# Patient Record
Sex: Female | Born: 1955
Health system: Southern US, Community
[De-identification: ages and names within clinical notes are randomized; demographics above are authoritative.]

## PROBLEM LIST (undated history)

## (undated) DIAGNOSIS — I771 Stricture of artery: Secondary | ICD-10-CM

## (undated) DIAGNOSIS — E785 Hyperlipidemia, unspecified: Secondary | ICD-10-CM

## (undated) DIAGNOSIS — Z789 Other specified health status: Secondary | ICD-10-CM

## (undated) HISTORY — DX: Other specified health status: Z78.9

## (undated) HISTORY — PX: NO PAST SURGERIES: SHX2092

---

## 1998-04-08 ENCOUNTER — Other Ambulatory Visit: Admission: RE | Admit: 1998-04-08 | Discharge: 1998-04-08 | Payer: Self-pay | Admitting: Oral Surgery

## 1998-07-13 ENCOUNTER — Other Ambulatory Visit: Admission: RE | Admit: 1998-07-13 | Discharge: 1998-07-13 | Payer: Self-pay | Admitting: Obstetrics & Gynecology

## 1999-04-13 ENCOUNTER — Other Ambulatory Visit: Admission: RE | Admit: 1999-04-13 | Discharge: 1999-04-13 | Payer: Self-pay | Admitting: Obstetrics and Gynecology

## 2000-05-25 ENCOUNTER — Other Ambulatory Visit: Admission: RE | Admit: 2000-05-25 | Discharge: 2000-05-25 | Payer: Self-pay | Admitting: Obstetrics and Gynecology

## 2001-06-25 ENCOUNTER — Other Ambulatory Visit: Admission: RE | Admit: 2001-06-25 | Discharge: 2001-06-25 | Payer: Self-pay | Admitting: Obstetrics and Gynecology

## 2002-06-27 ENCOUNTER — Other Ambulatory Visit: Admission: RE | Admit: 2002-06-27 | Discharge: 2002-06-27 | Payer: Self-pay | Admitting: Obstetrics and Gynecology

## 2003-08-26 ENCOUNTER — Other Ambulatory Visit: Admission: RE | Admit: 2003-08-26 | Discharge: 2003-08-26 | Payer: Self-pay | Admitting: Obstetrics and Gynecology

## 2004-08-31 ENCOUNTER — Other Ambulatory Visit: Admission: RE | Admit: 2004-08-31 | Discharge: 2004-08-31 | Payer: Self-pay | Admitting: Obstetrics and Gynecology

## 2005-09-01 ENCOUNTER — Other Ambulatory Visit: Admission: RE | Admit: 2005-09-01 | Discharge: 2005-09-01 | Payer: Self-pay | Admitting: Obstetrics and Gynecology

## 2008-06-29 ENCOUNTER — Emergency Department (HOSPITAL_COMMUNITY): Admission: EM | Admit: 2008-06-29 | Discharge: 2008-06-29 | Payer: Self-pay | Admitting: Emergency Medicine

## 2009-10-08 LAB — HM PAP SMEAR

## 2010-01-05 ENCOUNTER — Ambulatory Visit: Payer: Self-pay | Admitting: Family Medicine

## 2010-01-05 DIAGNOSIS — F17200 Nicotine dependence, unspecified, uncomplicated: Secondary | ICD-10-CM | POA: Insufficient documentation

## 2010-01-05 DIAGNOSIS — Z720 Tobacco use: Secondary | ICD-10-CM | POA: Insufficient documentation

## 2010-01-05 DIAGNOSIS — R5383 Other fatigue: Secondary | ICD-10-CM

## 2010-01-05 DIAGNOSIS — R5381 Other malaise: Secondary | ICD-10-CM | POA: Insufficient documentation

## 2010-01-05 LAB — HM COLONOSCOPY

## 2010-01-05 LAB — CONVERTED CEMR LAB: Vit D, 25-Hydroxy: 53 ng/mL (ref 30–89)

## 2010-01-06 ENCOUNTER — Encounter: Payer: Self-pay | Admitting: Family Medicine

## 2010-01-06 LAB — CONVERTED CEMR LAB
BUN: 17 mg/dL (ref 6–23)
Eosinophils Relative: 1.2 % (ref 0.0–5.0)
GFR calc non Af Amer: 70.07 mL/min (ref >60)
HCT: 39 % (ref 36.0–46.0)
Lymphs Abs: 2.2 10*3/uL (ref 0.7–4.0)
Monocytes Relative: 5.7 % (ref 3.0–12.0)
Neutrophils Relative %: 55.1 % (ref 43.0–77.0)
Platelets: 163 10*3/uL (ref 150.0–400.0)
Potassium: 4.4 meq/L (ref 3.5–5.1)
TSH: 1.02 microintl units/mL (ref 0.35–5.50)
WBC: 5.9 10*3/uL (ref 4.5–10.5)

## 2010-01-20 ENCOUNTER — Encounter: Payer: Self-pay | Admitting: Family Medicine

## 2010-02-22 ENCOUNTER — Encounter (INDEPENDENT_AMBULATORY_CARE_PROVIDER_SITE_OTHER): Payer: Self-pay | Admitting: Internal Medicine

## 2010-05-10 NOTE — Letter (Signed)
Summary: Records Dated 07-16-84 thru 07-08-02/[facility]  Records Dated 07-16-84 thru 07-08-02/[facility]   Imported By: Lanelle Bal 02/07/2010 12:07:39  _____________________________________________________________________  External Attachment:    Type:   Image     Comment:   External Document

## 2010-05-10 NOTE — Assessment & Plan Note (Signed)
Summary: NEW PT TO EST/CLE   Vital Signs:  Patient profile:   55 year old female Height:      62.5 inches Weight:      135 pounds BMI:     24.39 Temp:     98.4 degrees F oral Pulse rate:   76 / minute Pulse rhythm:   regular BP sitting:   110 / 70  (right arm) Cuff size:   regular  Vitals Entered By: Linde Gillis CMA Duncan Dull) (January 05, 2010 10:41 AM) CC: new patient, establish care   History of Present Illness: 55 yo here to establish care.  Fatigue- has noticed ove past several months that she is much more tired.  Busy year for her, daughter in law had twin babies, mother has cancer. Denies any difficulty sleeping, feels like she sleeps all the time. Not depressed, not anxious. No SOB, LE edema or CP No headaches.  Well woman- sees OBGYN Dr. Huel Cote yearly.  Per pt, UTD on lipid panel, mammogram, pap smear, bone density (awaiting records). Has never had a colonoscopy, does stool cards yaerly.  Tobacco abuse- currently smoking 10 cig/day (cut back from 1 ppd), has smoked for 30 years.  Quit cold [place] for 3 years when he husband quit smoking.  He is smoking now as well.  Does want to quit so she can be around for her grandchildren.  Current Medications (verified): 1)  None  Allergies (verified): No Known Drug Allergies  Past History:  Family History: Last updated: 01/05/2010 Mom- throat CA and breast CA in 88s Dad- healthy  Social History: Last updated: 01/05/2010 daughter in law, Adri Schloss. One son. current smoker.  Past Medical History: Unremarkable  Past Surgical History: Denies surgical history  Family History: Mom- throat CA and breast CA in 55s Dad- healthy  Social History: daughter in Social worker, Taralynn Quiett. One son. current smoker.  Review of Systems      See HPI General:  Complains of fatigue; denies weakness and weight loss. Eyes:  Denies blurring. ENT:  Denies difficulty swallowing. CV:  Denies chest pain or  discomfort. Resp:  Denies shortness of breath. GI:  Denies abdominal pain, bloody stools, and change in bowel habits. GU:  Denies abnormal vaginal bleeding, urinary frequency, and urinary hesitancy. MS:  Denies joint pain, joint redness, and joint swelling. Derm:  Denies rash. Neuro:  Denies headaches. Psych:  Denies anxiety and depression. Endo:  Denies cold intolerance, heat intolerance, polyuria, and weight change. Heme:  Denies abnormal bruising, bleeding, enlarge lymph nodes, fevers, pallor, and skin discoloration.  Physical Exam  General:  alert, well-developed, well-nourished, and well-hydrated.   Head:  normocephalic and atraumatic.   Eyes:  vision grossly intact, pupils equal, pupils round, and pupils reactive to light.   Ears:  R ear normal and L ear normal.   Nose:  no external deformity.   Mouth:  good dentition.   Neck:  No deformities, masses, or tenderness noted. Lungs:  Normal respiratory effort, chest expands symmetrically. Lungs are clear to auscultation, no crackles or wheezes. Heart:  Normal rate and regular rhythm. S1 and S2 normal without gallop, murmur, click, rub or other extra sounds. Abdomen:  Bowel sounds positive,abdomen soft and non-tender without masses, organomegaly or hernias noted. Msk:  normal ROM, no joint tenderness, no joint swelling, no joint warmth, and no redness over joints.   Extremities:  no edema Neurologic:  alert & oriented X3 and gait normal.   Skin:  Intact without suspicious lesions or  rashes Cervical Nodes:  No lymphadenopathy noted Psych:  Cognition and judgment appear intact. Alert and cooperative with normal attention span and concentration. No apparent delusions, illusions, hallucinations   Impression & Recommendations:  Problem # 1:  FATIGUE (ICD-780.79) Assessment New Likely due to busy lifestyle with recent stressors, but will check BMET, CBC, TSH to rule out reversible causes. Orders: Venipuncture (16109) TLB-BMP (Basic  Metabolic Panel-BMET) (80048-METABOL) TLB-CBC Platelet - w/Differential (85025-CBCD) TLB-TSH (Thyroid Stimulating Hormone) (84443-TSH)  Problem # 2:  R/O VITAMIN D DEFICIENCY (ICD-268.9) Assessment: New Given new history of fatigue and no calcium or vit d supplementation, will check Vit D today. Orders: T-Vitamin D (25-Hydroxy) [id number])  Problem # 3:  TOBACCO USER (ICD-305.1) Assessment: Unchanged  Counselled on smoking cessation.  She will think about it and talk with her husband, may want to try Chantix.  Orders: Tobacco use cessation intermediate 3-10 minutes (91478)  Prior Medications (reviewed today): None Current Allergies (reviewed today): No known allergies    Prevention & Chronic Care Immunizations   Influenza vaccine: Not documented    Tetanus booster: Not documented    Pneumococcal vaccine: Not documented  Colorectal Screening   Hemoccult: Not documented   Hemoccult due: Not Indicated    Colonoscopy: Not documented   Colonoscopy action/deferral: Deferred  (01/05/2010)   Colonoscopy due: Refused  (01/05/2010)  Other Screening   Pap smear: normal  (10/08/2009)   Pap smear due: 10/09/2010    Mammogram: normal  (07/12/2009)   Mammogram due: 07/13/2010   Smoking status: Not documented  Lipids   Total Cholesterol: Not documented   Lipid panel action/deferral: Not indicated   LDL: Not documented   LDL Direct: Not documented   HDL: Not documented   Triglycerides: Not documented   Nursing Instructions: Give tetanus booster today Give Flu vaccine today    Colonoscopy Next Due:  Refused Hemoccult Next Due:  Not Indicated PAP Result Date:  10/08/2009 PAP Result:  normal Mammogram Result Date:  07/12/2009 Mammogram Result:  normal    Family History:    Mom- throat CA and breast CA in 49s    Dad- healthy  Social History:    daughter in Social worker, Atia Haupt.    One son.    current smoker.   Appended Document: NEW PT TO  EST/CLE     Allergies: No Known Drug Allergies   Other Orders: Flu Vaccine 72yrs + (29562) Admin 1st Vaccine (13086) Tdap => 22yrs IM (57846) Admin of Any Addtl Vaccine (96295)    Immunizations Administered:  Influenza Vaccine # 1:    Vaccine Type: Fluvax 3+    Site: left deltoid    Mfr: GlaxoSmithKline    Dose: 0.5 ml    Route: IM    Given by: Linde Gillis CMA (AAMA)    Exp. Date: 10/08/2010    Lot #: [id number]    VIS given: 11/02/09 version given January 05, 2010.  Tetanus Vaccine:    Vaccine Type: Tdap    Site: left deltoid    Mfr: GlaxoSmithKline    Dose: 0.5 ml    Route: IM    Given by: Linde Gillis CMA (AAMA)    Exp. Date: 01/28/2012    Lot #: [id number]    VIS given: 02/26/08 version given January 05, 2010.

## 2010-05-10 NOTE — Letter (Signed)
Summary: Generic Letter  [place] [facility]  [address] [place], [place] [address]   Phone: [phone]  Fax: [phone]    01/06/2010  Deanna Bell [address] [place], [place]  [address]  Dear Ms. Wessling,     We have received your lab results and Dr. Dayton Martes says that all of your labs including blood count, thyroid, vitamam d, electrolytes, and kidney function are within normal limits.  Enclosed is a copy of your lab results.       Sincerely,       Linde Gillis CMA (AAMA)for Dr. Ruthe Mannan, MD

## 2010-05-10 NOTE — Progress Notes (Signed)
Summary: Office Visit//DEPRESSION SCREENING  Office Visit//DEPRESSION SCREENING   Imported By: Arta Bruce 02/24/2010 10:46:07  _____________________________________________________________________  External Attachment:    Type:   Image     Comment:   External Document

## 2010-10-31 ENCOUNTER — Ambulatory Visit (INDEPENDENT_AMBULATORY_CARE_PROVIDER_SITE_OTHER): Payer: BC Managed Care – PPO | Admitting: Family Medicine

## 2010-10-31 ENCOUNTER — Encounter: Payer: Self-pay | Admitting: Family Medicine

## 2010-10-31 VITALS — BP 116/78 | HR 78 | Temp 99.3°F | Wt 136.5 lb

## 2010-10-31 DIAGNOSIS — J029 Acute pharyngitis, unspecified: Secondary | ICD-10-CM | POA: Insufficient documentation

## 2010-10-31 NOTE — Progress Notes (Signed)
  Subjective:    Patient ID: Deanna Bell, female    DOB: [birth year], 55 y.o.   MRN: [id number]  HPI  Sore Throat Patient complains of sore throat. Associated symptoms include swollen glands. Onset of symptoms was 1 day ago, and have been unchanged since that time. She is drinking plenty of fluids. She has not had recent close exposure to someone with proven streptococcal pharyngitis although she does take care of her grand children.   Review of Systems See HPI    Objective:   Physical Exam BP 116/78  Pulse 78  Temp(Src) 99.3 F (37.4 C) (Oral)  Wt 136 lb 8 oz (61.916 kg) Gen: alert, pleasant, NAD HEENT:  Mild pharyngeal erythema, no exudate, no edema. +mild shotty adenopathy TMs clear bilaterally Lungs:  CTA bilaterally CVS: RRR        Assessment & Plan:   1. Pharyngitis    New.  Likely viral. Rapid strep neg. Advised supportive care. See pt instructions for details.

## 2010-10-31 NOTE — Patient Instructions (Signed)
Sore Throat Sore throats may be caused by bacteria and viruses. They may also be caused by:  Smoking.   Pollution.   Allergies.   HOME CARE INSTRUCTIONS  To treat a sore throat, take mild pain medicine.   Increase your fluids.   Eat a soft diet.   Do not smoke.   Gargling with warm water or salt water (1 tsp. salt in 8 oz. water) can be helpful.   Try throat sprays or lozenges or sucking on hard candy will also ease the symptoms.  Call your doctor if your sore throat lasts longer than 1 week.  SEEK IMMEDIATE MEDICAL CARE IF YOU HAVE:   Breathing difficulty.   Increased swelling in the throat.   Pain so severe you are unable to swallow fluids or your saliva.   A severe headache, high fever, vomiting, or red rash.  Document Released: 05/04/2004 Document Re-Released: 06/23/2008 [facility] Patient Information 2011 [place], [place].

## 2011-01-30 ENCOUNTER — Ambulatory Visit (INDEPENDENT_AMBULATORY_CARE_PROVIDER_SITE_OTHER): Payer: BC Managed Care – PPO | Admitting: Family Medicine

## 2011-01-30 ENCOUNTER — Encounter: Payer: Self-pay | Admitting: Family Medicine

## 2011-01-30 VITALS — BP 112/82 | HR 74 | Temp 99.2°F | Ht 61.0 in | Wt 134.5 lb

## 2011-01-30 DIAGNOSIS — L989 Disorder of the skin and subcutaneous tissue, unspecified: Secondary | ICD-10-CM

## 2011-01-30 MED ORDER — AZITHROMYCIN 250 MG PO TABS: ORAL_TABLET | ORAL | Status: DC

## 2011-01-30 NOTE — Patient Instructions (Signed)
Good to see you. Please stop by to see Deanna Bell on your way out. 

## 2011-01-30 NOTE — Progress Notes (Signed)
  Subjective:    Patient ID: Deanna Bell, female    DOB: [birth year], 55 y.o.   MRN: [id number]  HPI  55 yo here for ?cyst on inner aspect of right foot. Has been there for months, but now it is growing in size and causing discomfort. No redness or warmth. Has not drained anything.  Per pt, had similar cyst removed from toe years ago and was told it was a ganglion.  Patient Active Problem List  Diagnoses  . TOBACCO USER  . FATIGUE  . Pharyngitis  . Foot lesion   No past medical history on file. No past surgical history on file. History  Substance Use Topics  . Smoking status: Current Everyday Smoker  . Smokeless tobacco: Not on file  . Alcohol Use:    Family History  Problem Relation Age of Onset  . Cancer Mother 74    throat and breast    No Known Allergies No current outpatient prescriptions on file prior to visit.   The PMH, PSH, Social History, Family History, Medications, and allergies have been reviewed in [facility], and have been updated if relevant.   Review of Systems    See HPI Objective:   Physical Exam BP 112/82  Pulse 74  Temp(Src) 99.2 F (37.3 C) (Oral)  Ht 5\' 1"  (1.549 m)  Wt 134 lb 8 oz (61.009 kg)  BMI 25.41 kg/m2  General:  Well-developed,well-nourished,in no acute distress; alert,appropriate and cooperative throughout examination Head:  normocephalic and atraumatic.   Msk:  Large, non fluctuant subcutaneous lesion on inner aspect of right foot.  Not freely moveable.  Psych:  Cognition and judgment appear intact. Alert and cooperative with normal attention span and concentration. No apparent delusions, illusions, hallucinations     Assessment & Plan:   1. Foot lesion  Ambulatory referral to Podiatry  Deteriorated. Not consistent with ganglion cyst but does appear like cyst. Will refer to podiatry for further work up.

## 2011-07-06 ENCOUNTER — Telehealth: Payer: Self-pay | Admitting: Family Medicine

## 2011-07-06 NOTE — Telephone Encounter (Signed)
Noted  

## 2011-07-06 NOTE — Telephone Encounter (Signed)
Triage Record Num: [id number] Operator: Geanie Berlin Patient Name: Deanna Bell Call Date & Time: 07/06/2011 2:40:24PM Patient Phone: [phone] PCP: Ruthe Mannan Patient Gender: Female PCP Fax : [phone] Patient DOB: Oct 13, 1955 Practice Name: [facility] Reason for Call: Caller: Debbie/Patient; PCP: Ruthe Mannan [facility]); CB#: [phone]; Call regarding Head Cold Symptoms; Onset: 07/03/11. Afebrile. Asking if needs Zpack. Body aches present. Menopausal. Home care advice given for new onset 2 or > cold symptoms per URI Guideline. Protocol(s) Used: Upper Respiratory Infection (URI) Recommended Outcome per Protocol: Provide Home/Self Care Reason for Outcome: New onset of two or more of the following symptoms: nasal congestion with runny nose; sneezing; itchy or mild sore throat; mild headache or body aches; mild fatigue; low grade fever up to 101.5 F (38.6C) usually lasting about a week Care Advice: ~ Use a cool mist humidifier to moisten air. Be sure to clean according to manufacturer's instructions. ~ Call provider if symptoms worsen or new symptoms develop. ~ Consider use of a saline nasal spray per package directions to help relieve nasal congestion. Mild symptoms of a cold can be expected to last 7 to 10 days. Sometimes, a cough associated with a cold can last up to 3 weeks. Over-the-counter cold medications may temporarily relieve the symptoms, but do not shorten the length of the cold. ~ ~ SYMPTOM / CONDITION MANAGEMENT A warm, moist compress placed on face, over eyes for 15 to 20 minutes, 5 to 6 times a day, may help relieve the congestion. ~ Most adults need to drink 6-10 eight-ounce glasses (1.2-2.0 liters) of fluids per day unless previously told to limit fluid intake for other medical reasons. Limit fluids that contain caffeine, sugar or alcohol. Urine will be a very light yellow color when you drink enough fluids. ~ Sore Throat Relief: - Use warm  salt water gargles 3 to 4 times/day, as needed (1/2 tsp. salt in 8 oz. [.2 liters] water). - Suck on hard candy, nonprescription or herbal throat lozenges (sugar-free if diabetic) - Eat soothing, soft food/fluids (broths, soups, or honey and lemon juice in hot tea, Popsicles, frozen yogurt or sherbet, scrambled eggs, cooked cereals, Jell-O or puddings) whichever is most comforting. - Avoid eating salty, spicy or acidic foods. ~ ~ Rest until symptoms improve. If more than [redacted] weeks pregnant, lie on left side when resting. Be aware that some nonprescription drugs contain both an antihistamine and decongestant. If taking a combination drug, do not take an additional decongestant. ~ Analgesic/Antipyretic Advice - NSAIDs: Consider aspirin, ibuprofen, naproxen or ketoprofen for pain or fever as directed on label or by pharmacist/provider. PRECAUTIONS: - If over 27 years of age, should not take longer than 1 week without consulting provider. EXCEPTIONS: - Should not be used if taking blood thinners or have bleeding problems. - Do not use if have history of sensitivity/allergy to any of these medications; or history of cardiovascular, ulcer, kidney, liver disease or diabetes unless approved by provider. - Do not exceed recommended dose or frequency. ~ 07/06/2011 2:55:53PM Page 1 of 2 [email or web address] [facility] Triage Call Report Patient Name: Deanna Bell continuation page/s - Do not exceed recommended dose or frequency. Respiratory Hygiene: - Cover the nose/mouth tightly with a tissue when coughing or sneezing. - Use tissue 1 time and discard in the nearest waste receptacle. - Wash hands with soap and water or alcohol-based hand rub after coming into contact with respiratory secretions and contaminated objects/materials. - Alternatively when no tissue is available, cough into the bend  of the elbow. - .Avoid touching your eyes, nose or mouth. ~ 07/06/2011 2:55:53PM Page 2 of 2  [email or web address]_[email or web address]

## 2011-07-06 NOTE — Telephone Encounter (Signed)
Caller: Debbie/Patient; PCP: Gwinda Passe); CB#: [phone];  Call regarding Head Cold Symptoms; Onset: 07/03/11.  Afebrile.  Asking if needs Zpack. Body aches present.  Menopausal.  Home care advice given for new onset  2 or > cold symptoms per URI Guideline.

## 2011-08-22 ENCOUNTER — Ambulatory Visit (INDEPENDENT_AMBULATORY_CARE_PROVIDER_SITE_OTHER): Payer: BC Managed Care – PPO | Admitting: Family Medicine

## 2011-08-22 ENCOUNTER — Encounter: Payer: Self-pay | Admitting: Family Medicine

## 2011-08-22 VITALS — BP 118/70 | HR 68 | Temp 98.1°F | Wt 134.0 lb

## 2011-08-22 DIAGNOSIS — J40 Bronchitis, not specified as acute or chronic: Secondary | ICD-10-CM

## 2011-08-22 MED ORDER — AZITHROMYCIN 250 MG PO TABS: ORAL_TABLET | ORAL | Status: AC

## 2011-08-22 MED ORDER — ALBUTEROL SULFATE HFA 108 (90 BASE) MCG/ACT IN AERS: 2.0000 | INHALATION_SPRAY | Freq: Four times a day (QID) | RESPIRATORY_TRACT | Status: DC | PRN

## 2011-08-22 NOTE — Patient Instructions (Signed)
Good to see you. You have bronchitis. Take Zpack as directed. Use albuterol inhaler as needed for shortness of breath/wheezing. Continue Mucinex.

## 2011-08-22 NOTE — Progress Notes (Signed)
SUBJECTIVE:  Deanna Bell is a 55 y.o. female who complains of coryza, congestion and dry cough for 20 days. She denies a history of anorexia, chest pain, chills, dizziness, myalgias, nausea and shortness of breath and denies a history of asthma. Patient admits to smoke cigarettes.   Patient Active Problem List  Diagnoses  . TOBACCO USER  . FATIGUE  . Pharyngitis  . Foot lesion   No past medical history on file. No past surgical history on file. History  Substance Use Topics  . Smoking status: Current Everyday Smoker  . Smokeless tobacco: Not on file   Comment: Down to 4 cigs a day, trying to quit as of 08/22/11.  Marland Kitchen Alcohol Use: Not on file   Family History  Problem Relation Age of Onset  . Cancer Mother 34    throat and breast    No Known Allergies Current Outpatient Prescriptions on File Prior to Visit  Medication Sig Dispense Refill  . ibuprofen (ADVIL,MOTRIN) 200 MG tablet Take 200 mg by mouth every 6 (six) hours as needed.         The PMH, PSH, Social History, Family History, Medications, and allergies have been reviewed in [facility], and have been updated if relevant.   OBJECTIVE: BP 118/70  Pulse 68  Temp(Src) 98.1 F (36.7 C) (Oral)  Wt 134 lb (60.782 kg)  She appears well, vital signs are as noted. Ears normal.  Throat and pharynx normal.  Neck supple. No adenopathy in the neck. Nose is congested. Sinuses non tender. Pos faint exp wheezes bilaterally, no increased WOB or crackles.  ASSESSMENT:  bronchitis  PLAN: Zpack as directed. Albuterol as needed- see pt instructions for details. Symptomatic therapy suggested: push fluids, rest and return office visit prn if symptoms persist or worsen. Call or return to clinic prn if these symptoms worsen or fail to improve as anticipated.

## 2013-08-08 LAB — HM MAMMOGRAPHY: HM MAMMO: NORMAL

## 2013-12-12 ENCOUNTER — Ambulatory Visit (INDEPENDENT_AMBULATORY_CARE_PROVIDER_SITE_OTHER): Payer: BC Managed Care – PPO

## 2013-12-12 DIAGNOSIS — Z23 Encounter for immunization: Secondary | ICD-10-CM

## 2014-01-12 ENCOUNTER — Ambulatory Visit: Payer: BC Managed Care – PPO | Admitting: Family Medicine

## 2014-01-14 ENCOUNTER — Encounter: Payer: Self-pay | Admitting: Family Medicine

## 2014-01-14 ENCOUNTER — Ambulatory Visit
Admission: RE | Admit: 2014-01-14 | Discharge: 2014-01-14 | Disposition: A | Discharge status: Home / Self Care | Payer: BC Managed Care – PPO | Source: Ambulatory Visit | Attending: Family Medicine | Admitting: Family Medicine

## 2014-01-14 ENCOUNTER — Ambulatory Visit (INDEPENDENT_AMBULATORY_CARE_PROVIDER_SITE_OTHER)
Admission: RE | Admit: 2014-01-14 | Discharge: 2014-01-14 | Disposition: A | Discharge status: Home / Self Care | Payer: BC Managed Care – PPO | Source: Ambulatory Visit | Attending: Family Medicine | Admitting: Family Medicine

## 2014-01-14 ENCOUNTER — Ambulatory Visit (INDEPENDENT_AMBULATORY_CARE_PROVIDER_SITE_OTHER): Payer: BC Managed Care – PPO | Admitting: Family Medicine

## 2014-01-14 VITALS — BP 118/60 | HR 69 | Temp 98.4°F | Ht 61.25 in | Wt 141.2 lb

## 2014-01-14 DIAGNOSIS — M25562 Pain in left knee: Secondary | ICD-10-CM

## 2014-01-14 DIAGNOSIS — M7652 Patellar tendinitis, left knee: Secondary | ICD-10-CM

## 2014-01-14 DIAGNOSIS — M25561 Pain in right knee: Secondary | ICD-10-CM

## 2014-01-14 NOTE — Progress Notes (Signed)
Pre visit review using our clinic review tool, if applicable. No additional management support is needed unless otherwise documented below in the visit note. 

## 2014-01-14 NOTE — Patient Instructions (Signed)
BODYHELIX  Www.bodyhelix.com  Use website instuctions for measurement of limb to determine size.   Look for "Patellar Helix" - this should be placed underneath the kneecap on the affected side and above the bony part at the upper end of your tibia. - It should fit in the soft spot where your patellar tendon is located.  Over the years, I have found that athletes and active people like this product the most with patellar tendonitis. It costs about 40 dollars.  (I have no financial interest in this company and gain nothing from recommending their products)   REFERRALS TO SPECIALISTS, SPECIAL TESTS (MRI, CT, ULTRASOUNDS)  MARION or LINDA will help you. ASK CHECK-IN FOR HELP.  Imaging / Special Testing referrals sometimes can be done same day if EMERGENCY, but others can take 2 or 3 days to get an appointment. Starting in 2015, many of the new Medicare plans and Obamacare plans take much longer.   Specialist appointment times vary a great deal, based on their schedule / openings. -- Some specialists have very long wait times. (Example. Dermatology. Multiple months  for non-cancer)

## 2014-01-14 NOTE — Progress Notes (Signed)
Dr. Frederico Hamman T. Sumayah Bearse, MD, [facility] [place], [address] Phone: [phone] Fax: [phone]  01/14/2014  Patient: Deanna Bell, MRN: [id number], DOB: 06-Jan-1956, 58 y.o.  Primary Physician:  Arnette Norris, MD  Chief Complaint: Knee Pain  Subjective:   Deanna Bell is a 58 y.o. very pleasant female patient who presents with the following:  Consulting Physician: Dr. Deborra Medina Reason: B knee pain  Both knees are hurting and progressing in the last 4 years. Do not hurt all the time. Steps are the worst.  No old injuries or surgery. Has always been a walker. Working out in the morning. Ibuprofen will help some.   Left knee worse - patellar tendon and anterior knee pain The patient has not had an effusion. No symptomatic giving-way. No mechanical clicking. Joint has not locked up. Patient has been able to walk without limping. The patient does not have pain going up and down stairs or rising from a seated position.   Pain location: more anterior, L > R Current physical activity: walking Prior Knee Surgery: none Current pain meds: Motrin Bracing: none  Past Medical History, Surgical History, Social History, Family History, Problem List, Medications, and Allergies have been reviewed and updated if relevant.  GEN: No fevers, chills. Nontoxic. Primarily MSK c/o today. MSK: Detailed in the HPI GI: tolerating PO intake without difficulty Neuro: No numbness, parasthesias, or tingling associated. Otherwise the pertinent positives of the ROS are noted above.   Objective:   BP 118/60  Pulse 69  Temp(Src) 98.4 F (36.9 C) (Oral)  Ht 5' 1.25" (1.556 m)  Wt 141 lb 4 oz (64.071 kg)  BMI 26.46 kg/m2  SpO2 97%   GEN: WDWN, NAD, Non-toxic, Alert & Oriented x 3 HEENT: Atraumatic, Normocephalic.  Ears and Nose: No external deformity. EXTR: No clubbing/cyanosis/edema NEURO: Normal gait.  PSYCH: Normally interactive.  Conversant. Not depressed or anxious appearing.  Calm demeanor.   Knee:  B Gait: Normal heel toe pattern ROM: 0-130 Effusion: neg Echymosis or edema: none Patellar tendon  TTP on the L Painful PLICA: neg Patellar grind: negative Medial and lateral patellar facet loading: negative medial and lateral joint lines:NT Mcmurray's neg Flexion-pinch neg Varus and valgus stress: stable Lachman: neg Ant and Post drawer: neg Hip abduction, IR, ER: WNL Hip flexion str: 5/5 Hip abd: 5/5 Quad: 5/5 VMO atrophy:No Hamstring concentric and eccentric: 5/5   Radiology: Dg Knee Ap/lat W/sunrise Left  01/14/2014   CLINICAL DATA:  Left knee pain  EXAM: DG KNEE - 3 VIEWS  COMPARISON:  None.  FINDINGS: There is no evidence of fracture, dislocation, or joint effusion. There is no evidence of arthropathy or other focal bone abnormality. Soft tissues are unremarkable.  IMPRESSION: Negative.   Electronically Signed   By: Kathreen Devoid   On: 01/14/2014 11:01   Dg Knee Ap/lat W/sunrise Right  01/14/2014   CLINICAL DATA:  Right knee pain.  EXAM: DG KNEE - 3 VIEWS  COMPARISON:  None.  FINDINGS: There is no evidence of fracture, dislocation, or joint effusion. There is no evidence of arthropathy . Minimal spurring of superior aspect of patella is noted. Soft tissues are unremarkable.  IMPRESSION: No significant abnormality seen in the right knee.   Electronically Signed   By: Sabino Dick M.D.   On: 01/14/2014 11:00    Assessment and Plan:   Patellar tendonitis, left - Plan: Ambulatory referral to Physical Therapy  Right knee  pain - Plan: DG Knee AP/LAT W/Sunrise Right, Ambulatory referral to Physical Therapy  Left knee pain - Plan: DG Knee AP/LAT W/Sunrise Left, Ambulatory referral to Physical Therapy  Minimal OA, but enough that it may be symptomatic at times. L Patellar tendinopathy is most painful right now.  Reviewed anatomy. Reviewed knee rehab from [facility] for patellar tendonitis.  Patellar  compression strap recommended for patient. Given information about "Patellar Helix" strap from [employer].  Formal PT for iontophoresis and eccentrics  I appreciate the opportunity to evaluate this very friendly patient. If you have any question regarding her care or prognosis, do not hesitate to ask.   Follow-up: prn  New Prescriptions   No medications on file   Orders Placed This Encounter  Procedures  . DG Knee AP/LAT W/Sunrise Right  . DG Knee AP/LAT W/Sunrise Left  . Ambulatory referral to Physical Therapy    Signed,  Frederico Hamman T. Jakiyah Stepney, MD   Patient's Medications  New Prescriptions   No medications on file  Previous Medications   IBUPROFEN (ADVIL,MOTRIN) 200 MG TABLET    Take 200 mg by mouth every 6 (six) hours as needed.    Modified Medications   No medications on file  Discontinued Medications   ALBUTEROL (PROVENTIL HFA;VENTOLIN HFA) 108 (90 BASE) MCG/ACT INHALER    Inhale 2 puffs into the lungs every 6 (six) hours as needed for wheezing.

## 2014-01-15 ENCOUNTER — Telehealth: Payer: Self-pay | Admitting: Family Medicine

## 2014-01-15 NOTE — Telephone Encounter (Signed)
emmi smoking

## 2014-01-23 ENCOUNTER — Telehealth: Payer: Self-pay | Admitting: Family Medicine

## 2014-01-23 NOTE — Telephone Encounter (Signed)
i think that is ok

## 2014-01-23 NOTE — Telephone Encounter (Signed)
Called the patient to schedule her PT. She said that she has been doing all the exercises you told her to do and she is not having any pain at this time. She also got the knee brace you recommended. She wants to cancel the PT and will call if she gets in any more pain.

## 2014-06-30 ENCOUNTER — Other Ambulatory Visit: Payer: Self-pay

## 2014-07-07 ENCOUNTER — Encounter: Payer: Self-pay | Admitting: Family Medicine

## 2014-07-14 ENCOUNTER — Other Ambulatory Visit: Payer: Self-pay

## 2014-07-16 ENCOUNTER — Other Ambulatory Visit: Payer: Self-pay | Admitting: Family Medicine

## 2014-07-16 DIAGNOSIS — Z01419 Encounter for gynecological examination (general) (routine) without abnormal findings: Secondary | ICD-10-CM | POA: Insufficient documentation

## 2014-07-20 ENCOUNTER — Other Ambulatory Visit (INDEPENDENT_AMBULATORY_CARE_PROVIDER_SITE_OTHER): Payer: BLUE CROSS/BLUE SHIELD

## 2014-07-20 DIAGNOSIS — Z01419 Encounter for gynecological examination (general) (routine) without abnormal findings: Secondary | ICD-10-CM

## 2014-07-20 DIAGNOSIS — Z0000 Encounter for general adult medical examination without abnormal findings: Secondary | ICD-10-CM

## 2014-07-20 LAB — CBC WITH DIFFERENTIAL/PLATELET
BASOS PCT: 0.4 % (ref 0.0–3.0)
Basophils Absolute: 0 10*3/uL (ref 0.0–0.1)
Eosinophils Absolute: 0.1 10*3/uL (ref 0.0–0.7)
Eosinophils Relative: 1.3 % (ref 0.0–5.0)
HCT: 40.3 % (ref 36.0–46.0)
HEMOGLOBIN: 13.8 g/dL (ref 12.0–15.0)
LYMPHS PCT: 33.2 % (ref 12.0–46.0)
Lymphs Abs: 1.8 10*3/uL (ref 0.7–4.0)
MCHC: 34.1 g/dL (ref 30.0–36.0)
MCV: 89.5 fl (ref 78.0–100.0)
MONOS PCT: 4.4 % (ref 3.0–12.0)
Monocytes Absolute: 0.2 10*3/uL (ref 0.1–1.0)
NEUTROS ABS: 3.2 10*3/uL (ref 1.4–7.7)
Neutrophils Relative %: 60.7 % (ref 43.0–77.0)
Platelets: 178 10*3/uL (ref 150.0–400.0)
RBC: 4.51 Mil/uL (ref 3.87–5.11)
RDW: 13 % (ref 11.5–15.5)
WBC: 5.3 10*3/uL (ref 4.0–10.5)

## 2014-07-20 LAB — COMPREHENSIVE METABOLIC PANEL
ALK PHOS: 58 U/L (ref 39–117)
ALT: 10 U/L (ref 0–35)
AST: 16 U/L (ref 0–37)
Albumin: 4.3 g/dL (ref 3.5–5.2)
BILIRUBIN TOTAL: 0.5 mg/dL (ref 0.2–1.2)
BUN: 15 mg/dL (ref 6–23)
CO2: 31 mEq/L (ref 19–32)
Calcium: 9.7 mg/dL (ref 8.4–10.5)
Chloride: 104 mEq/L (ref 96–112)
Creatinine, Ser: 0.84 mg/dL (ref 0.40–1.20)
GFR: 73.7 mL/min (ref >60.00)
GLUCOSE: 87 mg/dL (ref 70–99)
Potassium: 4.3 mEq/L (ref 3.5–5.1)
SODIUM: 140 meq/L (ref 135–145)
TOTAL PROTEIN: 7.6 g/dL (ref 6.0–8.3)

## 2014-07-20 LAB — LIPID PANEL
CHOL/HDL RATIO: 4
CHOLESTEROL: 214 mg/dL — AB (ref 0–200)
HDL: 56.5 mg/dL (ref >39.00)
LDL CALC: 138 mg/dL — AB (ref 0–99)
NonHDL: 157.5
Triglycerides: 97 mg/dL (ref 0.0–149.0)
VLDL: 19.4 mg/dL (ref 0.0–40.0)

## 2014-07-20 LAB — TSH: TSH: 1.65 u[IU]/mL (ref 0.35–4.50)

## 2014-07-28 ENCOUNTER — Encounter: Payer: Self-pay | Admitting: Family Medicine

## 2014-07-28 ENCOUNTER — Ambulatory Visit (INDEPENDENT_AMBULATORY_CARE_PROVIDER_SITE_OTHER): Payer: BLUE CROSS/BLUE SHIELD | Admitting: Family Medicine

## 2014-07-28 VITALS — BP 112/62 | HR 76 | Temp 98.2°F | Ht 61.0 in | Wt 137.0 lb

## 2014-07-28 DIAGNOSIS — Z1211 Encounter for screening for malignant neoplasm of colon: Secondary | ICD-10-CM

## 2014-07-28 DIAGNOSIS — Z01419 Encounter for gynecological examination (general) (routine) without abnormal findings: Secondary | ICD-10-CM

## 2014-07-28 DIAGNOSIS — Z0000 Encounter for general adult medical examination without abnormal findings: Secondary | ICD-10-CM

## 2014-07-28 NOTE — Assessment & Plan Note (Signed)
Reviewed preventive care protocols, scheduled due services, and updated immunizations Discussed nutrition, exercise, diet, and healthy lifestyle.  Agrees to IFOB- order placed.

## 2014-07-28 NOTE — Patient Instructions (Signed)
It was great to see you. Please stop by the lab to pick up your stool cards.

## 2014-07-28 NOTE — Progress Notes (Signed)
Pre visit review using our clinic review tool, if applicable. No additional management support is needed unless otherwise documented below in the visit note. 

## 2014-07-28 NOTE — Progress Notes (Signed)
Subjective:   Patient ID: Deanna Bell, female    DOB: [birth year], 59 y.o.   MRN: [id number]  Deanna Bell is a pleasant 59 y.o. year old female who presents to clinic today with Annual Exam  on 07/28/2014  HPI:  Has GYN- sees Dr. Marvel Plan. Had pap smear in 02/2014 Mammogram 08/2013 - [facility]- mammogram appt scheduled for next week  Sees derm every 6 months- has follow up scheduled in May.   Lab Results  Component Value Date   WBC 5.3 07/20/2014   HGB 13.8 07/20/2014   HCT 40.3 07/20/2014   MCV 89.5 07/20/2014   PLT 178.0 07/20/2014   Lab Results  Component Value Date   ALT 10 07/20/2014   AST 16 07/20/2014   ALKPHOS 58 07/20/2014   BILITOT 0.5 07/20/2014   Lab Results  Component Value Date   NA 140 07/20/2014   K 4.3 07/20/2014   CL 104 07/20/2014   CO2 31 07/20/2014   Lab Results  Component Value Date   CHOL 214* 07/20/2014   HDL 56.50 07/20/2014   LDLCALC 138* 07/20/2014   TRIG 97.0 07/20/2014   CHOLHDL 4 07/20/2014   Lab Results  Component Value Date   CREATININE 0.84 07/20/2014   Current Outpatient Prescriptions on File Prior to Visit  Medication Sig Dispense Refill  . ibuprofen (ADVIL,MOTRIN) 200 MG tablet Take 200 mg by mouth every 6 (six) hours as needed.       No current facility-administered medications on file prior to visit.    No Known Allergies  History reviewed. No pertinent past medical history.  History reviewed. No pertinent past surgical history.  Family History  Problem Relation Age of Onset  . Cancer Mother 85    throat and breast     History   Social History  . Marital Status: Single    Spouse Name: N/A  . Number of Children: 1  . Years of Education: N/A   Occupational History  .     Social History Main Topics  . Smoking status: Current Every Day Smoker  . Smokeless tobacco: Not on file     Comment: Down to 6 cigs a day, trying to quit   . Alcohol Use: No  . Drug Use: No  . Sexual Activity: Not on file    Other Topics Concern  . Not on file   Social History Narrative   The PMH, PSH, Social History, Family History, Medications, and allergies have been reviewed in [facility], and have been updated if relevant.   Review of Systems  Constitutional: Negative.   HENT: Negative.   Respiratory: Negative.   Cardiovascular: Negative.   Gastrointestinal: Negative.   Endocrine: Negative.   Genitourinary: Negative.   Musculoskeletal: Negative.   Skin: Negative.   Allergic/Immunologic: Negative.   Neurological: Negative.   Hematological: Negative.   Psychiatric/Behavioral: Negative.   All other systems reviewed and are negative.      Objective:    BP 112/62 mmHg  Pulse 76  Temp(Src) 98.2 F (36.8 C) (Oral)  Ht 5\' 1"  (1.549 m)  Wt 137 lb (62.143 kg)  BMI 25.90 kg/m2  SpO2 96%   Physical Exam    General:  Well-developed,well-nourished,in no acute distress; alert,appropriate and cooperative throughout examination Head:  normocephalic and atraumatic.   Eyes:  vision grossly intact, pupils equal, pupils round, and pupils reactive to light.   Ears:  R ear normal and L ear normal.   Nose:  no external deformity.  Mouth:  good dentition.   Neck:  No deformities, masses, or tenderness noted. Lungs:  Normal respiratory effort, chest expands symmetrically. Lungs are clear to auscultation, no crackles or wheezes. Heart:  Normal rate and regular rhythm. S1 and S2 normal without gallop, murmur, click, rub or other extra sounds. Abdomen:  Bowel sounds positive,abdomen soft and non-tender without masses, organomegaly or hernias noted. Msk:  No deformity or scoliosis noted of thoracic or lumbar spine.   Extremities:  No clubbing, cyanosis, edema, or deformity noted with normal full range of motion of all joints.   Neurologic:  alert & oriented X3 and gait normal.   Skin:  Intact without suspicious lesions or rashes Cervical Nodes:  No lymphadenopathy noted Axillary Nodes:  No palpable  lymphadenopathy Psych:  Cognition and judgment appear intact. Alert and cooperative with normal attention span and concentration. No apparent delusions, illusions, hallucinations      Assessment & Plan:   Well woman exam No Follow-up on file.

## 2014-08-03 ENCOUNTER — Other Ambulatory Visit (INDEPENDENT_AMBULATORY_CARE_PROVIDER_SITE_OTHER): Payer: BLUE CROSS/BLUE SHIELD

## 2014-08-03 DIAGNOSIS — Z1211 Encounter for screening for malignant neoplasm of colon: Secondary | ICD-10-CM | POA: Diagnosis not present

## 2014-08-03 LAB — FECAL OCCULT BLOOD, IMMUNOCHEMICAL: Fecal Occult Bld: NEGATIVE

## 2014-08-04 ENCOUNTER — Encounter: Payer: Self-pay | Admitting: *Deleted

## 2014-08-21 ENCOUNTER — Encounter: Payer: Self-pay | Admitting: *Deleted

## 2014-11-10 IMAGING — CR DG KNEE AP/LAT W/ SUNRISE*L*
3 series · 3 of 3 positions shown · non-contrast
Comparison: None.

CLINICAL DATA: Left knee pain

EXAM:
DG KNEE - 3 VIEWS

[view not recorded (1 of 3)]
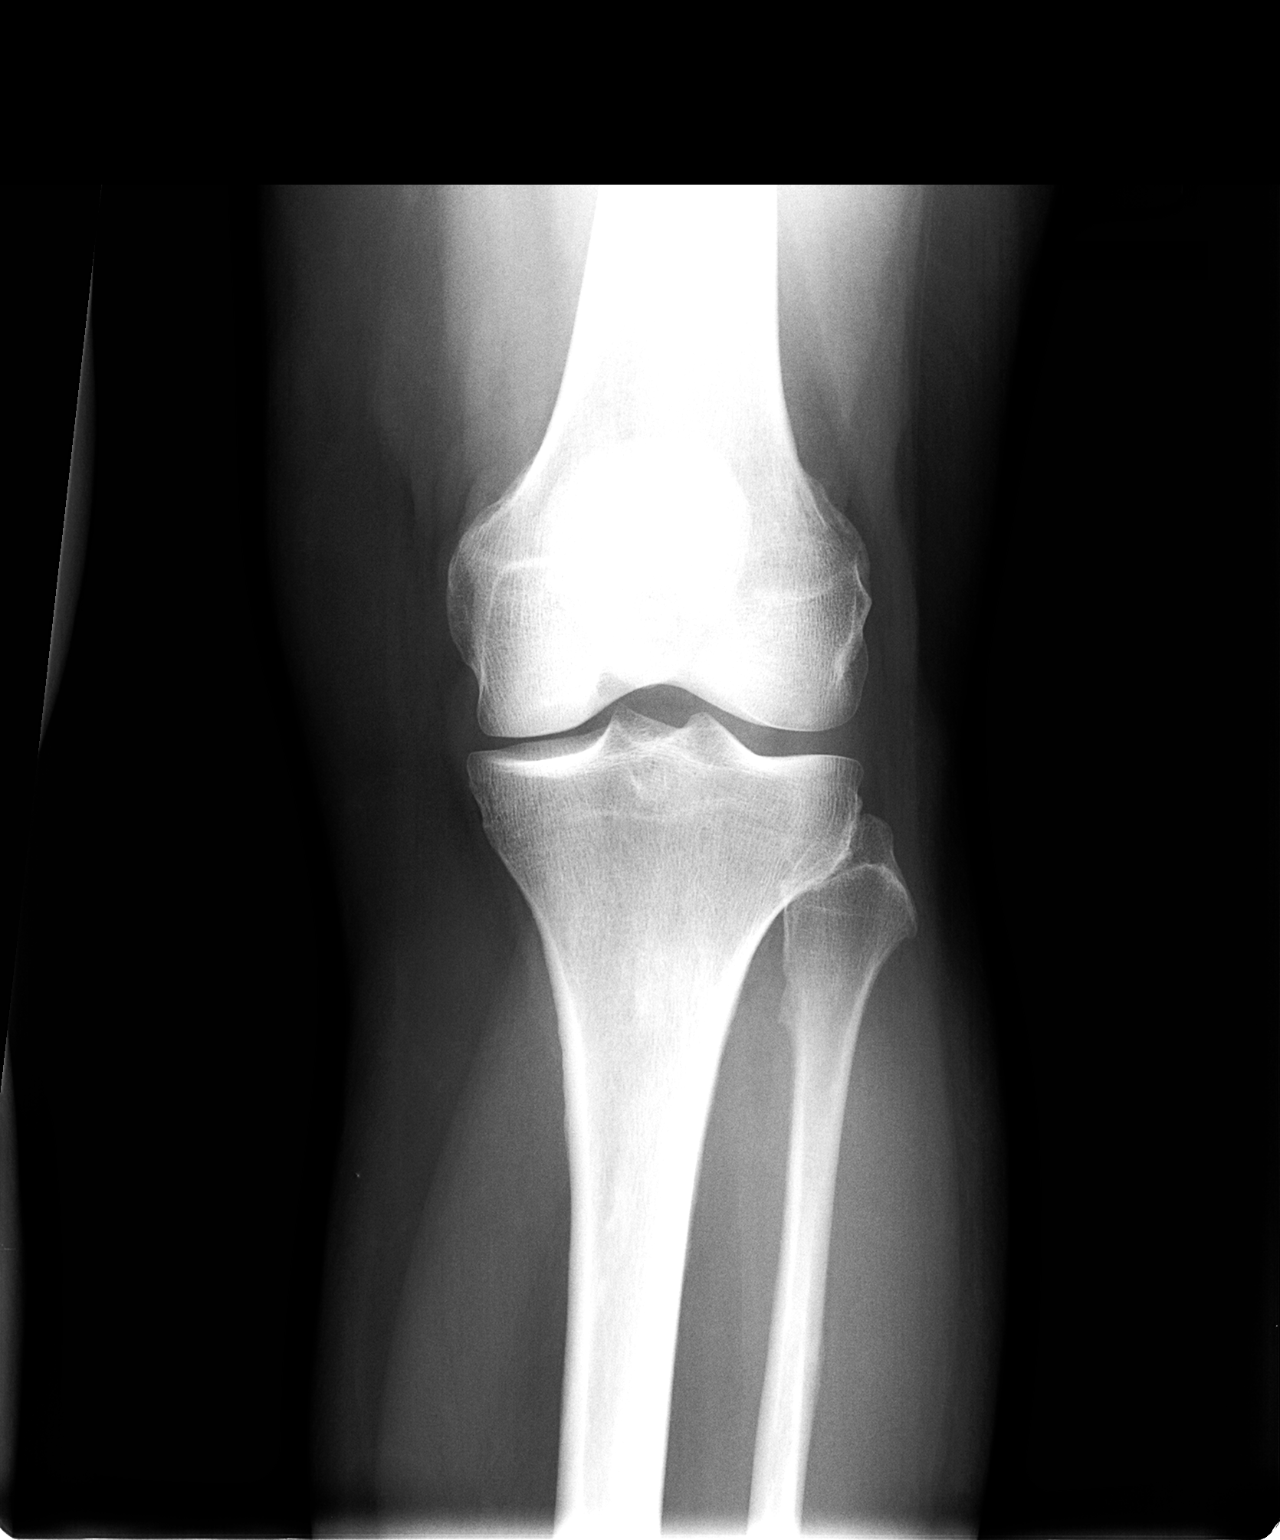

[view not recorded (2 of 3)]
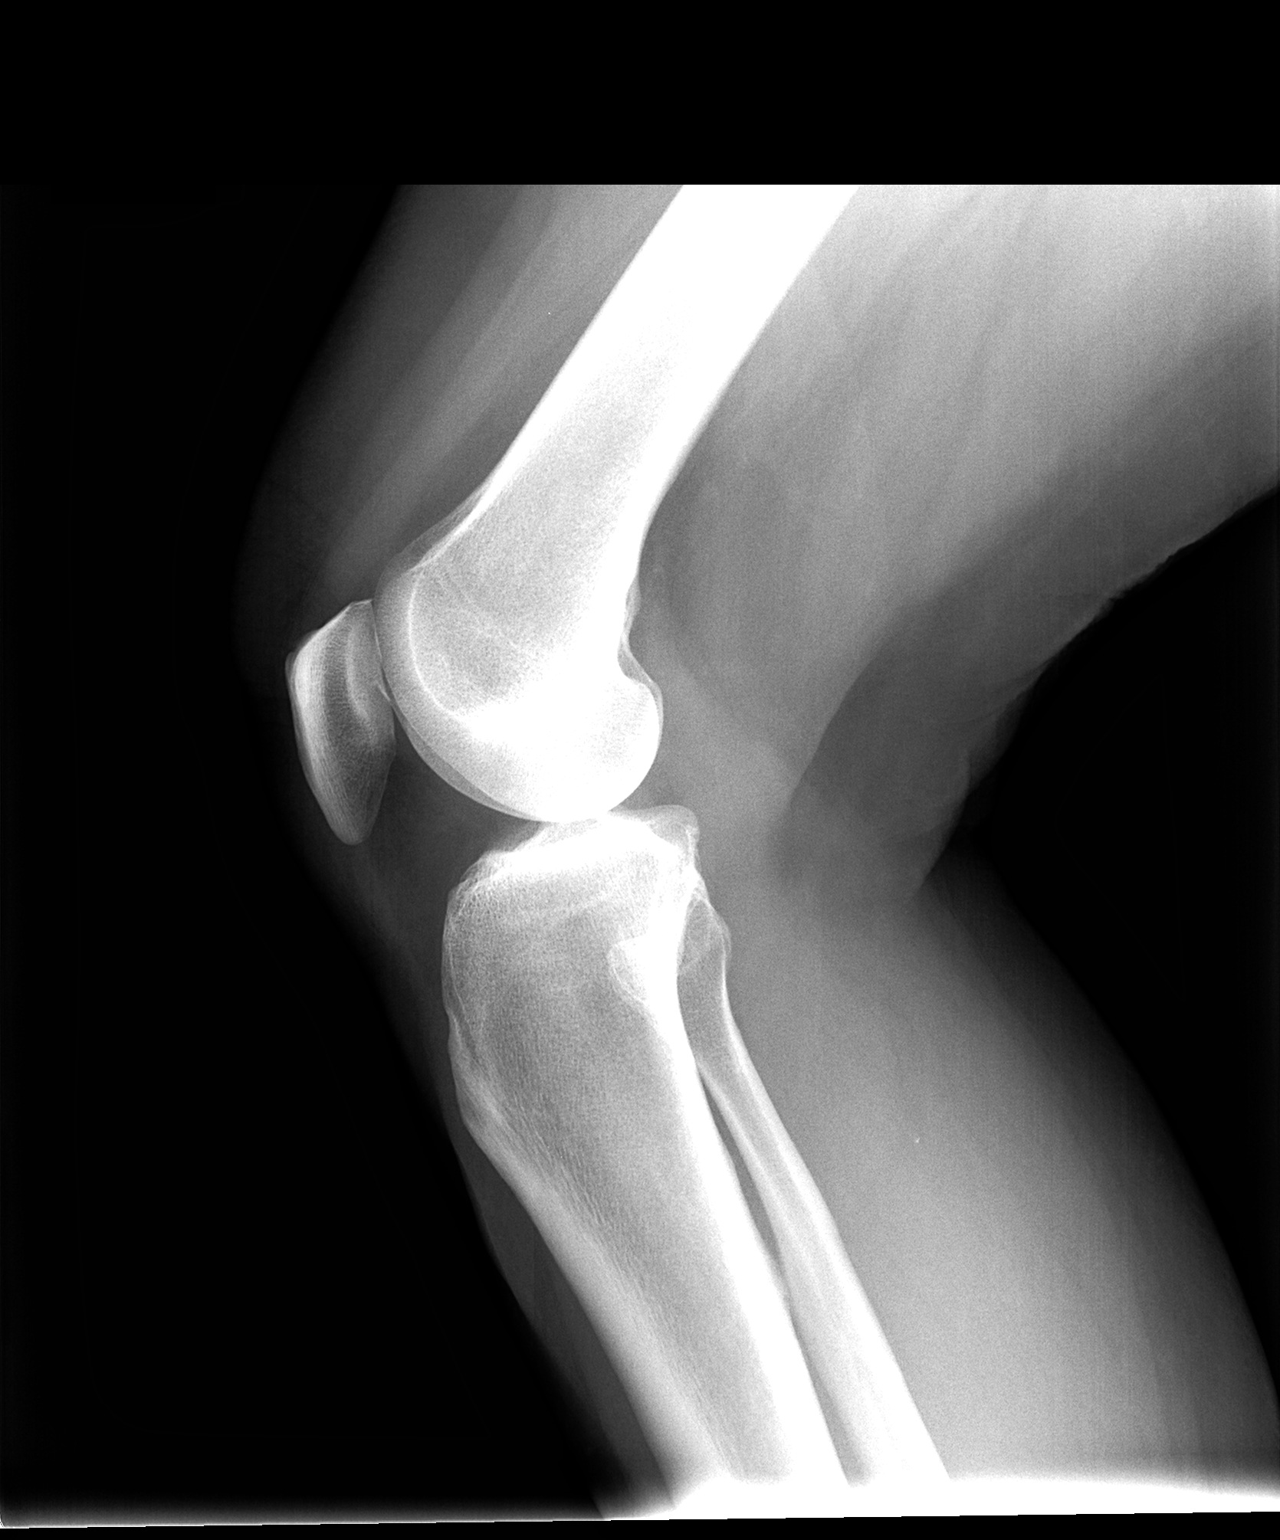

[view not recorded (3 of 3)]
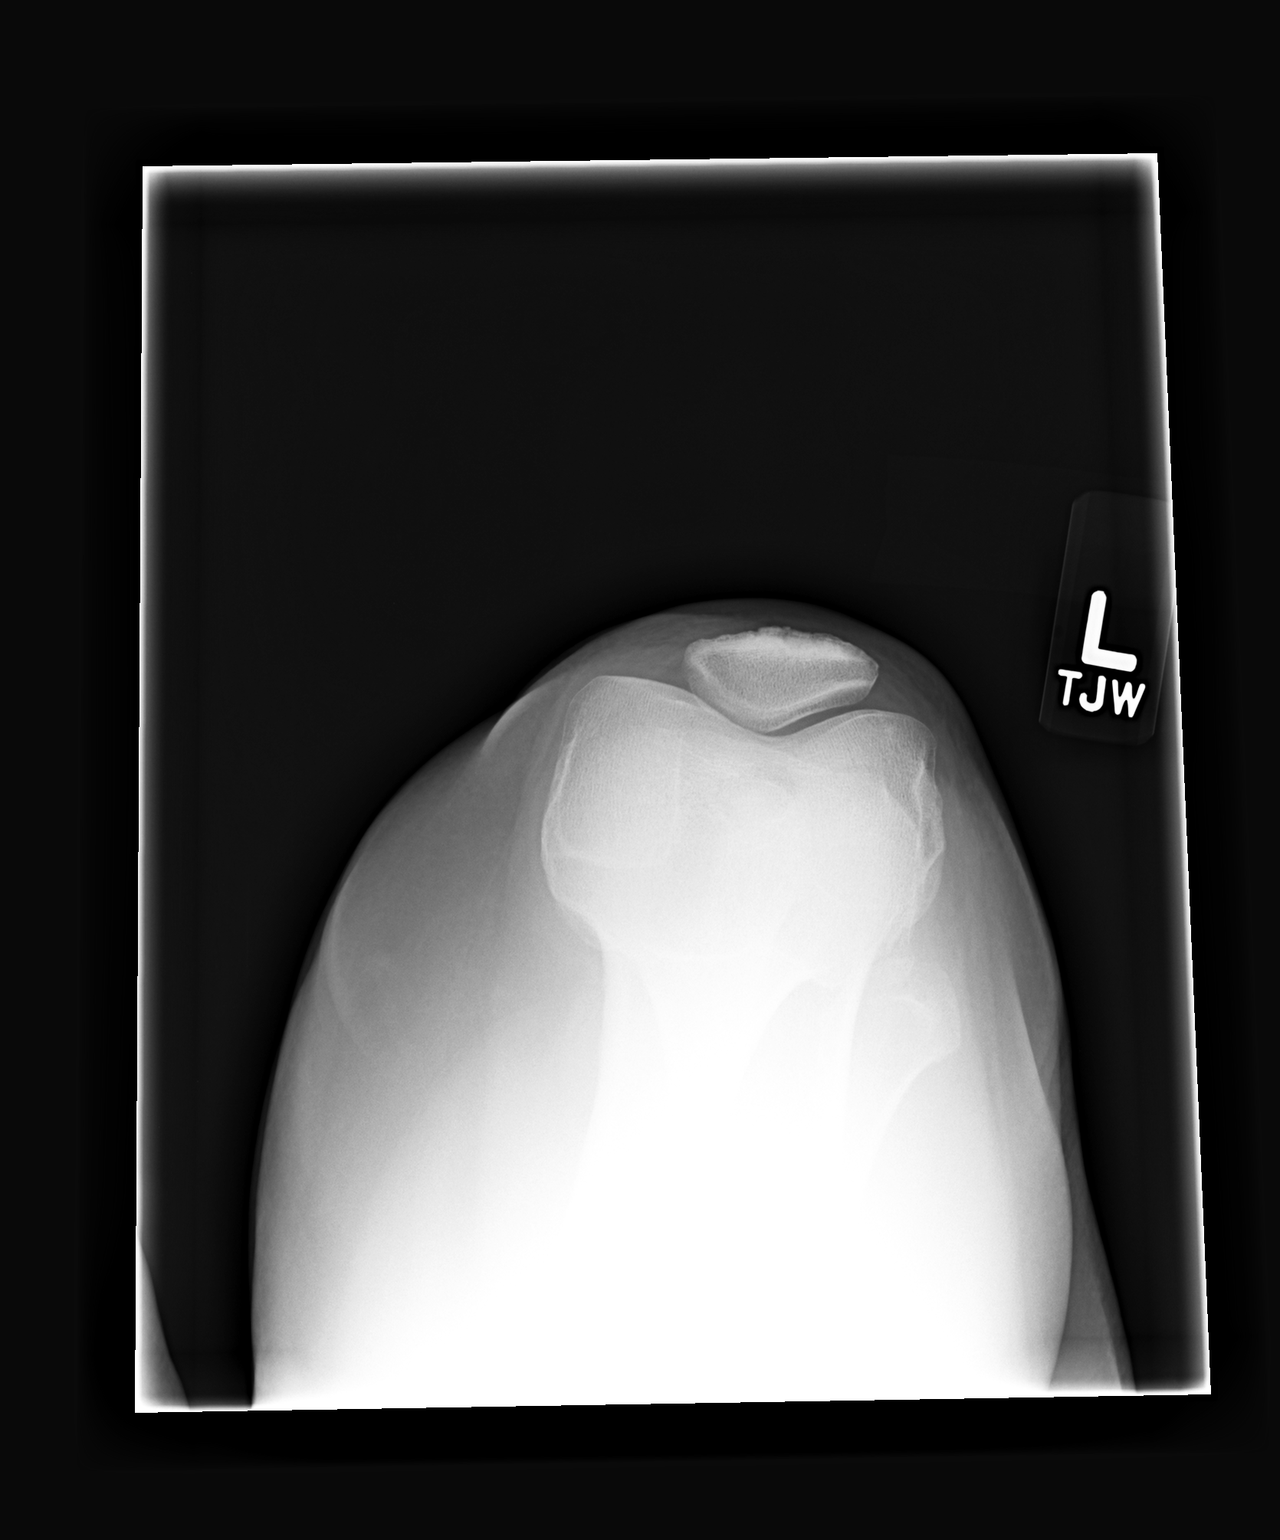

[3 of 3 positions shown; findings below may reference images not displayed]

FINDINGS: There is no evidence of fracture, dislocation, or joint effusion.
There is no evidence of arthropathy or other focal bone abnormality.
Soft tissues are unremarkable.
IMPRESSION: Negative.

## 2015-01-01 ENCOUNTER — Ambulatory Visit (INDEPENDENT_AMBULATORY_CARE_PROVIDER_SITE_OTHER): Payer: BLUE CROSS/BLUE SHIELD

## 2015-01-01 DIAGNOSIS — Z23 Encounter for immunization: Secondary | ICD-10-CM

## 2015-01-26 ENCOUNTER — Ambulatory Visit (INDEPENDENT_AMBULATORY_CARE_PROVIDER_SITE_OTHER): Payer: BLUE CROSS/BLUE SHIELD | Admitting: Podiatry

## 2015-01-26 ENCOUNTER — Ambulatory Visit (INDEPENDENT_AMBULATORY_CARE_PROVIDER_SITE_OTHER): Payer: BLUE CROSS/BLUE SHIELD

## 2015-01-26 ENCOUNTER — Encounter: Payer: Self-pay | Admitting: Podiatry

## 2015-01-26 VITALS — BP 124/70 | HR 80 | Resp 16 | Ht 61.0 in | Wt 127.0 lb

## 2015-01-26 DIAGNOSIS — M722 Plantar fascial fibromatosis: Secondary | ICD-10-CM | POA: Diagnosis not present

## 2015-01-26 MED ORDER — MELOXICAM 15 MG PO TABS: 15.0000 mg | ORAL_TABLET | Freq: Every day | ORAL | Status: DC

## 2015-01-26 NOTE — Patient Instructions (Signed)

## 2015-01-26 NOTE — Progress Notes (Signed)
   Subjective:    Patient ID: MALAIYA PACZKOWSKI, female    DOB: 1956-03-15, 59 y.o.   MRN: [id number]  HPI: She presents today for chief complaint of painful bilateral heels. She states they've been bothering her for quite some time possibly around springtime or early summer. She states that I have not seen the arches that are painful as well but these are not as bad as the heels themselves. She denies medications or treatment.    Review of Systems  Musculoskeletal: Positive for gait problem.       Objective:   Physical Exam: 59 year old white female vital signs stable alert and oriented 3 in good spirits in no apparent distress. She is alert and oriented 3 pulses are strongly palpable bilaterally. Neurologic sensorium is intact versus C monofilament. Deep tendon reflexes are intact muscle strength is 5 over 5 dorsiflexion plantar flexors and inverters everters all physical musculature is intact. Orthopedic evaluation to straight all joints distal to the ankle I will full range of motion without crepitation. Cutaneous evaluation and straight supple well-hydrated cutis she does have plantar fibroma central band of the plantar fascia measuring less than a centimeter in total diameter. She has pain on palpation medial calcaneal tubercles bilateral. Radiographic evaluation does demonstrates small plantar distally oriented calcaneal heel spurs with soft tissue increase in density at the plantar fascial calcaneal insertion site.        Assessment & Plan:  Assessment: Plantar fasciitis bilateral. Plantar fibromatosis bilateral.  Plan: We discussed the etiology pathology conservative versus surgical therapies. I injected the bilateral heels today with Kenalog and local anesthetic. Started her on meloxicam. In dispense plantar fascial braces. We discussed appropriate shoe gear stretching exercises ice therapy and sugar modifications.  Roselind Messier DPM

## 2015-04-13 ENCOUNTER — Ambulatory Visit (INDEPENDENT_AMBULATORY_CARE_PROVIDER_SITE_OTHER): Payer: BLUE CROSS/BLUE SHIELD | Admitting: Primary Care

## 2015-04-13 ENCOUNTER — Encounter: Payer: Self-pay | Admitting: Primary Care

## 2015-04-13 VITALS — BP 118/74 | HR 81 | Temp 98.0°F | Ht 61.0 in | Wt 135.1 lb

## 2015-04-13 DIAGNOSIS — R05 Cough: Secondary | ICD-10-CM

## 2015-04-13 DIAGNOSIS — R059 Cough, unspecified: Secondary | ICD-10-CM

## 2015-04-13 MED ORDER — BENZONATATE 200 MG PO CAPS: 200.0000 mg | ORAL_CAPSULE | Freq: Three times a day (TID) | ORAL | Status: DC | PRN

## 2015-04-13 NOTE — Progress Notes (Signed)
Pre visit review using our clinic review tool, if applicable. No additional management support is needed unless otherwise documented below in the visit note. 

## 2015-04-13 NOTE — Patient Instructions (Signed)
Cough: You may take Benzonatate capsules for cough. Take 1 capsule by mouth three times daily as needed for cough.  Nasal Congestion: Flonase (fluticasone) nasal spray. Instill 2 sprays in each nostril once daily.  Sneezing, throat drainage: Zyrtec at bedtime. Take this daily for at least 2 weeks.  Please call me Friday this week if no improvement in symptoms, or if you develop fevers over 101, productive cough with green sputum, shortness of breath.  It was a pleasure meeting you!  Upper Respiratory Infection, Adult Most upper respiratory infections (URIs) are a viral infection of the air passages leading to the lungs. A URI affects the nose, throat, and upper air passages. The most common type of URI is nasopharyngitis and is typically referred to as "the common cold." URIs run their course and usually go away on their own. Most of the time, a URI does not require medical attention, but sometimes a bacterial infection in the upper airways can follow a viral infection. This is called a secondary infection. Sinus and middle ear infections are common types of secondary upper respiratory infections. Bacterial pneumonia can also complicate a URI. A URI can worsen asthma and chronic obstructive pulmonary disease (COPD). Sometimes, these complications can require emergency medical care and may be life threatening.  CAUSES Almost all URIs are caused by viruses. A virus is a type of germ and can spread from one person to another.  RISKS FACTORS You may be at risk for a URI if:   You smoke.   You have chronic heart or lung disease.  You have a weakened defense (immune) system.   You are very young or very old.   You have nasal allergies or asthma.  You work in crowded or poorly ventilated areas.  You work in health care facilities or schools. SIGNS AND SYMPTOMS  Symptoms typically develop 2-3 days after you come in contact with a cold virus. Most viral URIs last 7-10 days. However, viral  URIs from the influenza virus (flu virus) can last 14-18 days and are typically more severe. Symptoms may include:   Runny or stuffy (congested) nose.   Sneezing.   Cough.   Sore throat.   Headache.   Fatigue.   Fever.   Loss of appetite.   Pain in your forehead, behind your eyes, and over your cheekbones (sinus pain).  Muscle aches.  DIAGNOSIS  Your health care provider may diagnose a URI by:  Physical exam.  Tests to check that your symptoms are not due to another condition such as:  Strep throat.  Sinusitis.  Pneumonia.  Asthma. TREATMENT  A URI goes away on its own with time. It cannot be cured with medicines, but medicines may be prescribed or recommended to relieve symptoms. Medicines may help:  Reduce your fever.  Reduce your cough.  Relieve nasal congestion. HOME CARE INSTRUCTIONS   Take medicines only as directed by your health care provider.   Gargle warm saltwater or take cough drops to comfort your throat as directed by your health care provider.  Use a warm mist humidifier or inhale steam from a shower to increase air moisture. This may make it easier to breathe.  Drink enough fluid to keep your urine clear or pale yellow.   Eat soups and other clear broths and maintain good nutrition.   Rest as needed.   Return to work when your temperature has returned to normal or as your health care provider advises. You may need to stay home longer  to avoid infecting others. You can also use a face mask and careful hand washing to prevent spread of the virus.  Increase the usage of your inhaler if you have asthma.   Do not use any tobacco products, including cigarettes, chewing tobacco, or electronic cigarettes. If you need help quitting, ask your health care provider. PREVENTION  The best way to protect yourself from getting a cold is to practice good hygiene.   Avoid oral or hand contact with people with cold symptoms.   Wash your  hands often if contact occurs.  There is no clear evidence that vitamin C, vitamin E, echinacea, or exercise reduces the chance of developing a cold. However, it is always recommended to get plenty of rest, exercise, and practice good nutrition.  SEEK MEDICAL CARE IF:   You are getting worse rather than better.   Your symptoms are not controlled by medicine.   You have chills.  You have worsening shortness of breath.  You have brown or red mucus.  You have yellow or brown nasal discharge.  You have pain in your face, especially when you bend forward.  You have a fever.  You have swollen neck glands.  You have pain while swallowing.  You have white areas in the back of your throat. SEEK IMMEDIATE MEDICAL CARE IF:   You have severe or persistent:  Headache.  Ear pain.  Sinus pain.  Chest pain.  You have chronic lung disease and any of the following:  Wheezing.  Prolonged cough.  Coughing up blood.  A change in your usual mucus.  You have a stiff neck.  You have changes in your:  Vision.  Hearing.  Thinking.  Mood. MAKE SURE YOU:   Understand these instructions.  Will watch your condition.  Will get help right away if you are not doing well or get worse.   This information is not intended to replace advice given to you by your health care provider. Make sure you discuss any questions you have with your health care provider.   Document Released: 09/20/2000 Document Revised: 08/11/2014 Document Reviewed: 07/02/2013 Elsevier Interactive Patient Education [employer].

## 2015-04-13 NOTE — Progress Notes (Signed)
   Subjective:    Patient ID: Deanna Bell, female    DOB: Apr 14, 1955, 60 y.o.   MRN: [id number]  HPI  Deanna Bell is a 60 year old female who presents today with a chief complaint of nasal congestion. She also reports cough, body aches, chills, nausea, sneezing. Denies fevers, ear pain, sore throat. Her symptoms have been present since Friday morning last week (4 days ago). Her cough is non-productive, denies fevers. She's taken robitussin and tylenol with temporary improvement.   Review of Systems  Constitutional: Negative for fever and chills.  HENT: Positive for congestion. Negative for ear pain and sore throat.   Respiratory: Positive for cough.   Gastrointestinal: Positive for nausea.  Musculoskeletal: Positive for myalgias.       No past medical history on file.  Social History   Social History  . Marital Status: Single    Spouse Name: N/A  . Number of Children: 1  . Years of Education: N/A   Occupational History  .     Social History Main Topics  . Smoking status: Current Every Day Smoker  . Smokeless tobacco: Not on file     Comment: Down to 6 cigs a day, trying to quit   . Alcohol Use: No  . Drug Use: No  . Sexual Activity: Not on file   Other Topics Concern  . Not on file   Social History Narrative    No past surgical history on file.  Family History  Problem Relation Age of Onset  . Cancer Mother 66    throat and breast     No Known Allergies  Current Outpatient Prescriptions on File Prior to Visit  Medication Sig Dispense Refill  . ibuprofen (ADVIL,MOTRIN) 200 MG tablet Take 200 mg by mouth every 6 (six) hours as needed. Reported on 04/13/2015    . meloxicam (MOBIC) 15 MG tablet Take 1 tablet (15 mg total) by mouth daily. (Patient not taking: Reported on 04/13/2015) 30 tablet 3   No current facility-administered medications on file prior to visit.    BP 118/74 mmHg  Pulse 81  Temp(Src) 98 F (36.7 C) (Oral)  Ht 5\' 1"  (1.549 m)  Wt 135 lb  1.9 oz (61.29 kg)  BMI 25.54 kg/m2  SpO2 98%    Objective:   Physical Exam  Constitutional: She appears well-nourished.  HENT:  Right Ear: Tympanic membrane and ear canal normal.  Left Ear: Tympanic membrane and ear canal normal.  Nose: Right sinus exhibits no maxillary sinus tenderness and no frontal sinus tenderness. Left sinus exhibits no maxillary sinus tenderness and no frontal sinus tenderness.  Mouth/Throat: Oropharynx is clear and moist.  Eyes: Conjunctivae are normal.  Neck: Neck supple.  Cardiovascular: Normal rate and regular rhythm.   Pulmonary/Chest: Effort normal and breath sounds normal. She has no wheezes. She has no rales.  Lymphadenopathy:    She has no cervical adenopathy.  Skin: Skin is warm and dry.          Assessment & Plan:  URI:  Cough x 4 days with body aches, chills, sneezing. Temporary improvement with OTC's. No sick contacts. Exam with clear lungs, normal HENT which is reassuring. Vitals WNL. Suspect viral URI and will treat with supportive measures.  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 Flonase, ibuprofen, Zyrtec, fluids, rest. Return precautions provided.

## 2015-08-24 DIAGNOSIS — D225 Melanocytic nevi of trunk: Secondary | ICD-10-CM | POA: Diagnosis not present

## 2015-08-24 DIAGNOSIS — L821 Other seborrheic keratosis: Secondary | ICD-10-CM | POA: Diagnosis not present

## 2015-08-24 DIAGNOSIS — L853 Xerosis cutis: Secondary | ICD-10-CM | POA: Diagnosis not present

## 2015-08-24 DIAGNOSIS — L570 Actinic keratosis: Secondary | ICD-10-CM | POA: Diagnosis not present

## 2015-08-24 DIAGNOSIS — L565 Disseminated superficial actinic porokeratosis (DSAP): Secondary | ICD-10-CM | POA: Diagnosis not present

## 2015-09-10 DIAGNOSIS — Z1231 Encounter for screening mammogram for malignant neoplasm of breast: Secondary | ICD-10-CM | POA: Diagnosis not present

## 2015-09-10 DIAGNOSIS — Z780 Asymptomatic menopausal state: Secondary | ICD-10-CM | POA: Diagnosis not present

## 2015-09-10 DIAGNOSIS — Z8262 Family history of osteoporosis: Secondary | ICD-10-CM | POA: Diagnosis not present

## 2015-09-10 DIAGNOSIS — Z803 Family history of malignant neoplasm of breast: Secondary | ICD-10-CM | POA: Diagnosis not present

## 2015-10-01 ENCOUNTER — Other Ambulatory Visit: Payer: Self-pay | Admitting: Family Medicine

## 2015-10-01 DIAGNOSIS — Z01419 Encounter for gynecological examination (general) (routine) without abnormal findings: Secondary | ICD-10-CM

## 2015-10-08 ENCOUNTER — Other Ambulatory Visit (INDEPENDENT_AMBULATORY_CARE_PROVIDER_SITE_OTHER): Payer: BLUE CROSS/BLUE SHIELD

## 2015-10-08 DIAGNOSIS — Z0000 Encounter for general adult medical examination without abnormal findings: Secondary | ICD-10-CM | POA: Diagnosis not present

## 2015-10-08 DIAGNOSIS — Z01419 Encounter for gynecological examination (general) (routine) without abnormal findings: Secondary | ICD-10-CM

## 2015-10-08 LAB — CBC WITH DIFFERENTIAL/PLATELET
BASOS ABS: 0 10*3/uL (ref 0.0–0.1)
Basophils Relative: 0.8 % (ref 0.0–3.0)
EOS ABS: 0.1 10*3/uL (ref 0.0–0.7)
Eosinophils Relative: 0.9 % (ref 0.0–5.0)
HCT: 38.6 % (ref 36.0–46.0)
Hemoglobin: 12.8 g/dL (ref 12.0–15.0)
LYMPHS ABS: 1.6 10*3/uL (ref 0.7–4.0)
Lymphocytes Relative: 26.6 % (ref 12.0–46.0)
MCHC: 33.2 g/dL (ref 30.0–36.0)
MCV: 90.7 fl (ref 78.0–100.0)
MONO ABS: 0.3 10*3/uL (ref 0.1–1.0)
Monocytes Relative: 4.4 % (ref 3.0–12.0)
NEUTROS PCT: 67.3 % (ref 43.0–77.0)
Neutro Abs: 4.1 10*3/uL (ref 1.4–7.7)
Platelets: 177 10*3/uL (ref 150.0–400.0)
RBC: 4.26 Mil/uL (ref 3.87–5.11)
RDW: 13.5 % (ref 11.5–15.5)
WBC: 6 10*3/uL (ref 4.0–10.5)

## 2015-10-08 LAB — COMPREHENSIVE METABOLIC PANEL
ALK PHOS: 56 U/L (ref 39–117)
ALT: 12 U/L (ref 0–35)
AST: 19 U/L (ref 0–37)
Albumin: 4.3 g/dL (ref 3.5–5.2)
BILIRUBIN TOTAL: 0.6 mg/dL (ref 0.2–1.2)
BUN: 15 mg/dL (ref 6–23)
CO2: 31 meq/L (ref 19–32)
Calcium: 9.6 mg/dL (ref 8.4–10.5)
Chloride: 104 mEq/L (ref 96–112)
Creatinine, Ser: 0.86 mg/dL (ref 0.40–1.20)
GFR: 71.43 mL/min (ref >60.00)
GLUCOSE: 102 mg/dL — AB (ref 70–99)
POTASSIUM: 4.5 meq/L (ref 3.5–5.1)
SODIUM: 140 meq/L (ref 135–145)
TOTAL PROTEIN: 7.4 g/dL (ref 6.0–8.3)

## 2015-10-08 LAB — LIPID PANEL
CHOL/HDL RATIO: 3
Cholesterol: 204 mg/dL — ABNORMAL HIGH (ref 0–200)
HDL: 64.2 mg/dL (ref >39.00)
LDL Cholesterol: 130 mg/dL — ABNORMAL HIGH (ref 0–99)
NONHDL: 139.67
Triglycerides: 50 mg/dL (ref 0.0–149.0)
VLDL: 10 mg/dL (ref 0.0–40.0)

## 2015-10-08 LAB — TSH: TSH: 1.09 u[IU]/mL (ref 0.35–4.50)

## 2015-10-13 ENCOUNTER — Ambulatory Visit (INDEPENDENT_AMBULATORY_CARE_PROVIDER_SITE_OTHER): Payer: BLUE CROSS/BLUE SHIELD | Admitting: Family Medicine

## 2015-10-13 ENCOUNTER — Encounter: Payer: Self-pay | Admitting: Family Medicine

## 2015-10-13 VITALS — BP 116/68 | HR 69 | Temp 97.8°F | Ht 61.0 in | Wt 133.5 lb

## 2015-10-13 DIAGNOSIS — Z01419 Encounter for gynecological examination (general) (routine) without abnormal findings: Secondary | ICD-10-CM

## 2015-10-13 DIAGNOSIS — Z0000 Encounter for general adult medical examination without abnormal findings: Secondary | ICD-10-CM

## 2015-10-13 NOTE — Progress Notes (Signed)
Subjective:   Patient ID: Deanna Bell, female    DOB: 1956-03-27, 60 y.o.   MRN: [id number]  Deanna Bell is a pleasant 60 y.o. year old female who presents to clinic today with Annual Exam  on 10/13/2015  HPI:  Has GYN- sees Dr. Marvel Plan. Had pap smear in 02/2015 Mammogram 09/01/2014 - [facility]  Sees derm every 6 months- had recent [place] removed.   Lab Results  Component Value Date   WBC 6.0 10/08/2015   HGB 12.8 10/08/2015   HCT 38.6 10/08/2015   MCV 90.7 10/08/2015   PLT 177.0 10/08/2015   Lab Results  Component Value Date   ALT 12 10/08/2015   AST 19 10/08/2015   ALKPHOS 56 10/08/2015   BILITOT 0.6 10/08/2015   Lab Results  Component Value Date   NA 140 10/08/2015   K 4.5 10/08/2015   CL 104 10/08/2015   CO2 31 10/08/2015   Lab Results  Component Value Date   CHOL 204* 10/08/2015   HDL 64.20 10/08/2015   LDLCALC 130* 10/08/2015   TRIG 50.0 10/08/2015   CHOLHDL 3 10/08/2015   Lab Results  Component Value Date   CREATININE 0.86 10/08/2015   Current Outpatient Prescriptions on File Prior to Visit  Medication Sig Dispense Refill  . ibuprofen (ADVIL,MOTRIN) 200 MG tablet Take 200 mg by mouth every 6 (six) hours as needed. Reported on 04/13/2015     No current facility-administered medications on file prior to visit.    No Known Allergies  No past medical history on file.  No past surgical history on file.  Family History  Problem Relation Age of Onset  . Cancer Mother 14    throat and breast     Social History   Social History  . Marital Status: Single    Spouse Name: N/A  . Number of Children: 1  . Years of Education: N/A   Occupational History  .     Social History Main Topics  . Smoking status: Current Every Day Smoker  . Smokeless tobacco: Not on file     Comment: Down to 6 cigs a day, trying to quit   . Alcohol Use: No  . Drug Use: No  . Sexual Activity: Not on file   Other Topics Concern  . Not on file   Social History  Narrative   The PMH, PSH, Social History, Family History, Medications, and allergies have been reviewed in [facility], and have been updated if relevant.   Review of Systems  Constitutional: Negative.   HENT: Negative.   Respiratory: Negative.   Cardiovascular: Negative.   Gastrointestinal: Negative.   Endocrine: Negative.   Genitourinary: Negative.   Musculoskeletal: Negative.   Skin: Negative.   Allergic/Immunologic: Negative.   Neurological: Negative.   Hematological: Negative.   Psychiatric/Behavioral: Negative.   All other systems reviewed and are negative.      Objective:    BP 116/68 mmHg  Pulse 69  Temp(Src) 97.8 F (36.6 C) (Oral)  Ht 5\' 1"  (1.549 m)  Wt 133 lb 8 oz (60.555 kg)  BMI 25.24 kg/m2  SpO2 99%   Physical Exam    General:  Well-developed,well-nourished,in no acute distress; alert,appropriate and cooperative throughout examination Head:  normocephalic and atraumatic.   Eyes:  vision grossly intact, pupils equal, pupils round, and pupils reactive to light.   Ears:  R ear normal and L ear normal.   Nose:  no external deformity.   Mouth:  good dentition.  Neck:  No deformities, masses, or tenderness noted. Lungs:  Normal respiratory effort, chest expands symmetrically. Lungs are clear to auscultation, no crackles or wheezes. Heart:  Normal rate and regular rhythm. S1 and S2 normal without gallop, murmur, click, rub or other extra sounds. Abdomen:  Bowel sounds positive,abdomen soft and non-tender without masses, organomegaly or hernias noted. Msk:  No deformity or scoliosis noted of thoracic or lumbar spine.   Extremities:  No clubbing, cyanosis, edema, or deformity noted with normal full range of motion of all joints.   Neurologic:  alert & oriented X3 and gait normal.   Skin:  Intact without suspicious lesions or rashes Cervical Nodes:  No lymphadenopathy noted Axillary Nodes:  No palpable lymphadenopathy Psych:  Cognition and judgment appear intact.  Alert and cooperative with normal attention span and concentration. No apparent delusions, illusions, hallucinations      Assessment & Plan:   Well woman exam No Follow-up on file.

## 2015-10-13 NOTE — Progress Notes (Signed)
Pre visit review using our clinic review tool, if applicable. No additional management support is needed unless otherwise documented below in the visit note. 

## 2015-10-13 NOTE — Patient Instructions (Signed)
Great to see you.  I am so sorry for your loss.  Check with your insurance to see if they will cover the shingles shot.

## 2015-10-13 NOTE — Assessment & Plan Note (Signed)
Reviewed preventive care protocols, scheduled due services, and updated immunizations Discussed nutrition, exercise, diet, and healthy lifestyle.  

## 2015-10-20 DIAGNOSIS — Z1212 Encounter for screening for malignant neoplasm of rectum: Secondary | ICD-10-CM | POA: Diagnosis not present

## 2015-10-20 DIAGNOSIS — Z1211 Encounter for screening for malignant neoplasm of colon: Secondary | ICD-10-CM | POA: Diagnosis not present

## 2015-10-20 LAB — COLOGUARD: COLOGUARD: NEGATIVE

## 2015-11-08 ENCOUNTER — Encounter: Payer: Self-pay | Admitting: Family Medicine

## 2015-11-11 ENCOUNTER — Telehealth: Payer: Self-pay | Admitting: Family Medicine

## 2015-11-11 NOTE — Telephone Encounter (Signed)
Patient called to get Cologuard results.  Patient said if she's not available,it's okay to leave a detailed message on her voice mail.

## 2015-11-11 NOTE — Telephone Encounter (Signed)
These results were received, reviewed, and signed off on (see pt chart); letter mailed advising pt. Spoke to pt and verbally provided results

## 2015-11-11 NOTE — Telephone Encounter (Signed)
Please check to see if we have received this.

## 2016-01-11 DIAGNOSIS — Z23 Encounter for immunization: Secondary | ICD-10-CM | POA: Diagnosis not present

## 2016-01-18 ENCOUNTER — Ambulatory Visit (INDEPENDENT_AMBULATORY_CARE_PROVIDER_SITE_OTHER): Payer: BLUE CROSS/BLUE SHIELD | Admitting: Podiatry

## 2016-01-18 ENCOUNTER — Encounter: Payer: Self-pay | Admitting: Podiatry

## 2016-01-18 DIAGNOSIS — M722 Plantar fascial fibromatosis: Secondary | ICD-10-CM | POA: Diagnosis not present

## 2016-01-18 MED ORDER — MELOXICAM 15 MG PO TABS: 15.0000 mg | ORAL_TABLET | Freq: Every day | ORAL | 3 refills | Status: DC

## 2016-01-19 NOTE — Patient Instructions (Signed)

## 2016-01-19 NOTE — Progress Notes (Signed)
She presents today with chief complaint of painful bilateral heels. She states they're sore nail right seems to be worse she states they didn't really haven't bothered her since last year.  Objective: Vital signs are stable she is alert and oriented 3. The last time I saw her her was October 2016 for the same problem. She still has pain on palpation medially continue tubercle pain strong palpable pulses bilateral.  Assessment: Plantar fasciitis bilateral.  Plan: Injected the bilateral heels today and placed her on meloxicam. Provided her with stretching exercises. Also dispensed myofascial braces and night splint.

## 2016-02-24 DIAGNOSIS — C44629 Squamous cell carcinoma of skin of left upper limb, including shoulder: Secondary | ICD-10-CM | POA: Diagnosis not present

## 2016-02-24 DIAGNOSIS — L570 Actinic keratosis: Secondary | ICD-10-CM | POA: Diagnosis not present

## 2016-03-14 DIAGNOSIS — Z1389 Encounter for screening for other disorder: Secondary | ICD-10-CM | POA: Diagnosis not present

## 2016-03-14 DIAGNOSIS — Z01419 Encounter for gynecological examination (general) (routine) without abnormal findings: Secondary | ICD-10-CM | POA: Diagnosis not present

## 2016-03-14 DIAGNOSIS — Z6826 Body mass index (BMI) 26.0-26.9, adult: Secondary | ICD-10-CM | POA: Diagnosis not present

## 2016-03-15 DIAGNOSIS — C44629 Squamous cell carcinoma of skin of left upper limb, including shoulder: Secondary | ICD-10-CM | POA: Diagnosis not present

## 2016-03-28 ENCOUNTER — Ambulatory Visit (INDEPENDENT_AMBULATORY_CARE_PROVIDER_SITE_OTHER): Payer: BLUE CROSS/BLUE SHIELD | Admitting: Podiatry

## 2016-03-28 ENCOUNTER — Encounter: Payer: Self-pay | Admitting: Podiatry

## 2016-03-28 DIAGNOSIS — M722 Plantar fascial fibromatosis: Secondary | ICD-10-CM

## 2016-03-28 MED ORDER — DICLOFENAC SODIUM 75 MG PO TBEC: 75.0000 mg | DELAYED_RELEASE_TABLET | Freq: Two times a day (BID) | ORAL | 3 refills | Status: DC

## 2016-03-29 NOTE — Progress Notes (Signed)
She presents today for follow-up of bilateral plantar fasciitis. She states the left one is doing great the right one is much more sore enough that this not in the medial arch.  Objective: Vital signs are stable alert and oriented 3. Pulses are palpable. She has a palpable mass center aspect medial band of plantar fascia medial longitudinal arch right foot most consistent with plantar fibroma. She also has pain on palpation medial calcaneal tubercle of the right foot.  Assessment: Plantar fasciitis right heel. Plantar fibromatosis right medial longitudinal arch. Well-healing plantar fasciitis left foot.  Plan: I injected both sites today the plantar fibroma as well as plantar fascial insertion site. We'll follow-up with her in about a month.

## 2016-05-09 ENCOUNTER — Ambulatory Visit: Payer: BLUE CROSS/BLUE SHIELD | Admitting: Podiatry

## 2016-07-26 DIAGNOSIS — Z85828 Personal history of other malignant neoplasm of skin: Secondary | ICD-10-CM | POA: Diagnosis not present

## 2016-07-26 DIAGNOSIS — D225 Melanocytic nevi of trunk: Secondary | ICD-10-CM | POA: Diagnosis not present

## 2016-07-26 DIAGNOSIS — L821 Other seborrheic keratosis: Secondary | ICD-10-CM | POA: Diagnosis not present

## 2016-07-26 DIAGNOSIS — L720 Epidermal cyst: Secondary | ICD-10-CM | POA: Diagnosis not present

## 2016-07-26 DIAGNOSIS — L570 Actinic keratosis: Secondary | ICD-10-CM | POA: Diagnosis not present

## 2016-09-15 DIAGNOSIS — Z1231 Encounter for screening mammogram for malignant neoplasm of breast: Secondary | ICD-10-CM | POA: Diagnosis not present

## 2016-10-05 ENCOUNTER — Other Ambulatory Visit: Payer: Self-pay | Admitting: Family Medicine

## 2016-10-05 DIAGNOSIS — Z01419 Encounter for gynecological examination (general) (routine) without abnormal findings: Secondary | ICD-10-CM

## 2016-10-13 ENCOUNTER — Other Ambulatory Visit (INDEPENDENT_AMBULATORY_CARE_PROVIDER_SITE_OTHER): Payer: BLUE CROSS/BLUE SHIELD

## 2016-10-13 DIAGNOSIS — Z01419 Encounter for gynecological examination (general) (routine) without abnormal findings: Secondary | ICD-10-CM | POA: Diagnosis not present

## 2016-10-13 LAB — LIPID PANEL
CHOLESTEROL: 223 mg/dL — AB (ref 0–200)
HDL: 59.6 mg/dL (ref >39.00)
LDL Cholesterol: 149 mg/dL — ABNORMAL HIGH (ref 0–99)
NonHDL: 163.56
Total CHOL/HDL Ratio: 4
Triglycerides: 74 mg/dL (ref 0.0–149.0)
VLDL: 14.8 mg/dL (ref 0.0–40.0)

## 2016-10-13 LAB — TSH: TSH: 0.94 u[IU]/mL (ref 0.35–4.50)

## 2016-10-13 LAB — CBC WITH DIFFERENTIAL/PLATELET
Basophils Absolute: 0 10*3/uL (ref 0.0–0.1)
Basophils Relative: 0.8 % (ref 0.0–3.0)
EOS PCT: 2.1 % (ref 0.0–5.0)
Eosinophils Absolute: 0.1 10*3/uL (ref 0.0–0.7)
HCT: 40.2 % (ref 36.0–46.0)
HEMOGLOBIN: 13.6 g/dL (ref 12.0–15.0)
Lymphocytes Relative: 27.2 % (ref 12.0–46.0)
Lymphs Abs: 1.2 10*3/uL (ref 0.7–4.0)
MCHC: 33.8 g/dL (ref 30.0–36.0)
MCV: 90.5 fl (ref 78.0–100.0)
MONO ABS: 0.3 10*3/uL (ref 0.1–1.0)
Monocytes Relative: 5.7 % (ref 3.0–12.0)
Neutro Abs: 2.9 10*3/uL (ref 1.4–7.7)
Neutrophils Relative %: 64.2 % (ref 43.0–77.0)
Platelets: 186 10*3/uL (ref 150.0–400.0)
RBC: 4.45 Mil/uL (ref 3.87–5.11)
RDW: 13.6 % (ref 11.5–15.5)
WBC: 4.5 10*3/uL (ref 4.0–10.5)

## 2016-10-13 LAB — COMPREHENSIVE METABOLIC PANEL
ALBUMIN: 4.4 g/dL (ref 3.5–5.2)
ALK PHOS: 63 U/L (ref 39–117)
ALT: 28 U/L (ref 0–35)
AST: 25 U/L (ref 0–37)
BILIRUBIN TOTAL: 0.6 mg/dL (ref 0.2–1.2)
BUN: 21 mg/dL (ref 6–23)
CO2: 31 mEq/L (ref 19–32)
CREATININE: 1 mg/dL (ref 0.40–1.20)
Calcium: 9.6 mg/dL (ref 8.4–10.5)
Chloride: 102 mEq/L (ref 96–112)
GFR: 59.82 mL/min — ABNORMAL LOW (ref >60.00)
Glucose, Bld: 100 mg/dL — ABNORMAL HIGH (ref 70–99)
POTASSIUM: 4.4 meq/L (ref 3.5–5.1)
SODIUM: 140 meq/L (ref 135–145)
TOTAL PROTEIN: 7.6 g/dL (ref 6.0–8.3)

## 2016-10-17 ENCOUNTER — Ambulatory Visit (INDEPENDENT_AMBULATORY_CARE_PROVIDER_SITE_OTHER): Payer: BLUE CROSS/BLUE SHIELD | Admitting: Family Medicine

## 2016-10-17 ENCOUNTER — Encounter: Payer: Self-pay | Admitting: Family Medicine

## 2016-10-17 VITALS — BP 108/70 | HR 71 | Ht 61.0 in | Wt 140.0 lb

## 2016-10-17 DIAGNOSIS — E785 Hyperlipidemia, unspecified: Secondary | ICD-10-CM | POA: Diagnosis not present

## 2016-10-17 DIAGNOSIS — Z01419 Encounter for gynecological examination (general) (routine) without abnormal findings: Secondary | ICD-10-CM

## 2016-10-17 NOTE — Assessment & Plan Note (Signed)
Reviewed preventive care protocols, scheduled due services, and updated immunizations Discussed nutrition, exercise, diet, and healthy lifestyle.  

## 2016-10-17 NOTE — Progress Notes (Signed)
Subjective:   Patient ID: Deanna Bell, female    DOB: 06-09-1955, 61 y.o.   MRN: [id number]  Deanna Bell is a pleasant 61 y.o. year old female who presents to clinic today with Annual Exam  on 10/17/2016  HPI:  Has GYN- sees Dr. Marvel Plan. Had pap smear in 03/2016, no post menopausal bleeding Mammogram 09/15/16  Never had a colonoscopy- did do cologuard on 10/20/15 which was negative.   Lab Results  Component Value Date   CHOL 223 (H) 10/13/2016   HDL 59.60 10/13/2016   LDLCALC 149 (H) 10/13/2016   TRIG 74.0 10/13/2016   CHOLHDL 4 10/13/2016   Lab Results  Component Value Date   WBC 4.5 10/13/2016   HGB 13.6 10/13/2016   HCT 40.2 10/13/2016   MCV 90.5 10/13/2016   PLT 186.0 10/13/2016   Lab Results  Component Value Date   TSH 0.94 10/13/2016   Lab Results  Component Value Date   NA 140 10/13/2016   K 4.4 10/13/2016   CL 102 10/13/2016   CO2 31 10/13/2016   Lab Results  Component Value Date   ALT 28 10/13/2016   AST 25 10/13/2016   ALKPHOS 63 10/13/2016   BILITOT 0.6 10/13/2016   Current Outpatient Prescriptions on File Prior to Visit  Medication Sig Dispense Refill  . diclofenac (VOLTAREN) 75 MG EC tablet Take 1 tablet (75 mg total) by mouth 2 (two) times daily. 60 tablet 3  . fluorouracil (EFUDEX) 5 % cream 1 APPLICATION APPLY ON THE SKIN NIGHTLY  0  . ibuprofen (ADVIL,MOTRIN) 200 MG tablet Take 200 mg by mouth every 6 (six) hours as needed. Reported on 04/13/2015    . meloxicam (MOBIC) 15 MG tablet Take 1 tablet (15 mg total) by mouth daily. 30 tablet 3   No current facility-administered medications on file prior to visit.     No Known Allergies  No past medical history on file.  No past surgical history on file.  Family History  Problem Relation Age of Onset  . Cancer Mother 55       throat and breast     Social History   Social History  . Marital status: Single    Spouse name: N/A  . Number of children: 1  . Years of education: N/A    Occupational History  .  Unemployed   Social History Main Topics  . Smoking status: Current Every Day Smoker  . Smokeless tobacco: Never Used     Comment: Down to 6 cigs a day, trying to quit   . Alcohol use No  . Drug use: No  . Sexual activity: Not on file   Other Topics Concern  . Not on file   Social History Narrative  . No narrative on file   The PMH, PSH, Social History, Family History, Medications, and allergies have been reviewed in [facility], and have been updated if relevant.     Review of Systems  Constitutional: Negative.   HENT: Negative.   Eyes: Negative.   Respiratory: Negative.   Cardiovascular: Negative.   Gastrointestinal: Negative.   Endocrine: Negative.   Genitourinary: Negative.   Musculoskeletal: Negative.   Allergic/Immunologic: Negative.   Neurological: Negative.   Psychiatric/Behavioral: Negative for agitation.  All other systems reviewed and are negative.      Objective:    BP 108/70   Pulse 71   Ht 5\' 1"  (1.549 m)   Wt 140 lb (63.5 kg)   SpO2 98%  BMI 26.45 kg/m    Physical Exam   General:  Well-developed,well-nourished,in no acute distress; alert,appropriate and cooperative throughout examination Head:  normocephalic and atraumatic.   Eyes:  vision grossly intact, PERRL Ears:  R ear normal and L ear normal externally, TMs clear bilaterally Nose:  no external deformity.   Mouth:  good dentition.   Neck:  No deformities, masses, or tenderness noted. Lungs:  Normal respiratory effort, chest expands symmetrically. Lungs are clear to auscultation, no crackles or wheezes. Heart:  Normal rate and regular rhythm. S1 and S2 normal without gallop, murmur, click, rub or other extra sounds. Abdomen:  Bowel sounds positive,abdomen soft and non-tender without masses, organomegaly or hernias noted. Msk:  No deformity or scoliosis noted of thoracic or lumbar spine.   Extremities:  No clubbing, cyanosis, edema, or deformity noted with normal  full range of motion of all joints.   Neurologic:  alert & oriented X3 and gait normal.   Skin:  Intact without suspicious lesions or rashes Psych:  Cognition and judgment appear intact. Alert and cooperative with normal attention span and concentration. No apparent delusions, illusions, hallucinations       Assessment & Plan:   Well woman exam No Follow-up on file.

## 2016-10-17 NOTE — Assessment & Plan Note (Signed)
Deteriorated. Discussed dietary changes. Handout given.

## 2016-12-22 DIAGNOSIS — W57XXXA Bitten or stung by nonvenomous insect and other nonvenomous arthropods, initial encounter: Secondary | ICD-10-CM | POA: Diagnosis not present

## 2016-12-22 DIAGNOSIS — S0096XA Insect bite (nonvenomous) of unspecified part of head, initial encounter: Secondary | ICD-10-CM | POA: Diagnosis not present

## 2016-12-29 DIAGNOSIS — Z23 Encounter for immunization: Secondary | ICD-10-CM | POA: Diagnosis not present

## 2017-02-13 DIAGNOSIS — D1801 Hemangioma of skin and subcutaneous tissue: Secondary | ICD-10-CM | POA: Diagnosis not present

## 2017-02-13 DIAGNOSIS — L820 Inflamed seborrheic keratosis: Secondary | ICD-10-CM | POA: Diagnosis not present

## 2017-02-13 DIAGNOSIS — D692 Other nonthrombocytopenic purpura: Secondary | ICD-10-CM | POA: Diagnosis not present

## 2017-02-13 DIAGNOSIS — L570 Actinic keratosis: Secondary | ICD-10-CM | POA: Diagnosis not present

## 2017-02-13 DIAGNOSIS — Z85828 Personal history of other malignant neoplasm of skin: Secondary | ICD-10-CM | POA: Diagnosis not present

## 2017-05-07 DIAGNOSIS — Z6826 Body mass index (BMI) 26.0-26.9, adult: Secondary | ICD-10-CM | POA: Diagnosis not present

## 2017-05-07 DIAGNOSIS — Z124 Encounter for screening for malignant neoplasm of cervix: Secondary | ICD-10-CM | POA: Diagnosis not present

## 2017-05-07 DIAGNOSIS — Z1151 Encounter for screening for human papillomavirus (HPV): Secondary | ICD-10-CM | POA: Diagnosis not present

## 2017-05-07 DIAGNOSIS — Z1389 Encounter for screening for other disorder: Secondary | ICD-10-CM | POA: Diagnosis not present

## 2017-05-07 DIAGNOSIS — Z01419 Encounter for gynecological examination (general) (routine) without abnormal findings: Secondary | ICD-10-CM | POA: Diagnosis not present

## 2017-05-07 DIAGNOSIS — Z130 Encounter for screening for diseases of the blood and blood-forming organs and certain disorders involving the immune mechanism: Secondary | ICD-10-CM | POA: Diagnosis not present

## 2017-05-07 LAB — HM PAP SMEAR: HM Pap smear: NORMAL

## 2017-08-21 DIAGNOSIS — L570 Actinic keratosis: Secondary | ICD-10-CM | POA: Diagnosis not present

## 2017-08-21 DIAGNOSIS — L918 Other hypertrophic disorders of the skin: Secondary | ICD-10-CM | POA: Diagnosis not present

## 2017-08-21 DIAGNOSIS — Z85828 Personal history of other malignant neoplasm of skin: Secondary | ICD-10-CM | POA: Diagnosis not present

## 2017-08-21 DIAGNOSIS — S20361A Insect bite (nonvenomous) of right front wall of thorax, initial encounter: Secondary | ICD-10-CM | POA: Diagnosis not present

## 2017-09-28 DIAGNOSIS — Z803 Family history of malignant neoplasm of breast: Secondary | ICD-10-CM | POA: Diagnosis not present

## 2017-09-28 DIAGNOSIS — Z1231 Encounter for screening mammogram for malignant neoplasm of breast: Secondary | ICD-10-CM | POA: Diagnosis not present

## 2017-09-28 LAB — HM MAMMOGRAPHY

## 2017-11-06 ENCOUNTER — Encounter: Payer: BLUE CROSS/BLUE SHIELD | Admitting: Family Medicine

## 2017-12-12 ENCOUNTER — Encounter: Payer: Self-pay | Admitting: Family Medicine

## 2017-12-12 ENCOUNTER — Ambulatory Visit (INDEPENDENT_AMBULATORY_CARE_PROVIDER_SITE_OTHER): Payer: BLUE CROSS/BLUE SHIELD | Admitting: Family Medicine

## 2017-12-12 VITALS — BP 132/66 | HR 81 | Temp 99.1°F | Ht 61.25 in | Wt 139.0 lb

## 2017-12-12 DIAGNOSIS — Z01419 Encounter for gynecological examination (general) (routine) without abnormal findings: Secondary | ICD-10-CM

## 2017-12-12 DIAGNOSIS — Z0000 Encounter for general adult medical examination without abnormal findings: Secondary | ICD-10-CM | POA: Insufficient documentation

## 2017-12-12 DIAGNOSIS — E785 Hyperlipidemia, unspecified: Secondary | ICD-10-CM

## 2017-12-12 LAB — COMPREHENSIVE METABOLIC PANEL
ALBUMIN: 4.4 g/dL (ref 3.5–5.2)
ALK PHOS: 58 U/L (ref 39–117)
ALT: 11 U/L (ref 0–35)
AST: 19 U/L (ref 0–37)
BILIRUBIN TOTAL: 0.5 mg/dL (ref 0.2–1.2)
BUN: 13 mg/dL (ref 6–23)
CALCIUM: 9.6 mg/dL (ref 8.4–10.5)
CO2: 30 mEq/L (ref 19–32)
CREATININE: 0.92 mg/dL (ref 0.40–1.20)
Chloride: 104 mEq/L (ref 96–112)
GFR: 65.61 mL/min (ref >60.00)
Glucose, Bld: 92 mg/dL (ref 70–99)
Potassium: 4.5 mEq/L (ref 3.5–5.1)
Sodium: 141 mEq/L (ref 135–145)
TOTAL PROTEIN: 7.5 g/dL (ref 6.0–8.3)

## 2017-12-12 LAB — CBC WITH DIFFERENTIAL/PLATELET
BASOS ABS: 0 10*3/uL (ref 0.0–0.1)
Basophils Relative: 0.8 % (ref 0.0–3.0)
EOS ABS: 0.1 10*3/uL (ref 0.0–0.7)
Eosinophils Relative: 1.2 % (ref 0.0–5.0)
HCT: 41.6 % (ref 36.0–46.0)
HEMOGLOBIN: 14 g/dL (ref 12.0–15.0)
LYMPHS ABS: 1.8 10*3/uL (ref 0.7–4.0)
Lymphocytes Relative: 34.1 % (ref 12.0–46.0)
MCHC: 33.7 g/dL (ref 30.0–36.0)
MCV: 92.1 fl (ref 78.0–100.0)
MONO ABS: 0.3 10*3/uL (ref 0.1–1.0)
Monocytes Relative: 5.3 % (ref 3.0–12.0)
NEUTROS PCT: 58.6 % (ref 43.0–77.0)
Neutro Abs: 3.2 10*3/uL (ref 1.4–7.7)
Platelets: 180 10*3/uL (ref 150.0–400.0)
RBC: 4.52 Mil/uL (ref 3.87–5.11)
RDW: 13.4 % (ref 11.5–15.5)
WBC: 5.4 10*3/uL (ref 4.0–10.5)

## 2017-12-12 LAB — LIPID PANEL
CHOLESTEROL: 217 mg/dL — AB (ref 0–200)
HDL: 56.9 mg/dL (ref >39.00)
LDL Cholesterol: 139 mg/dL — ABNORMAL HIGH (ref 0–99)
NONHDL: 159.73
Total CHOL/HDL Ratio: 4
Triglycerides: 104 mg/dL (ref 0.0–149.0)
VLDL: 20.8 mg/dL (ref 0.0–40.0)

## 2017-12-12 LAB — TSH: TSH: 1.08 u[IU]/mL (ref 0.35–4.50)

## 2017-12-12 NOTE — Assessment & Plan Note (Signed)
Reviewed preventive care protocols, scheduled due services, and updated immunizations Discussed nutrition, exercise, diet, and healthy lifestyle.  Orders Placed This Encounter  Procedures  . Comp Met (CMET)  . TSH  . CBC w/Diff  . Lipid Profile

## 2017-12-12 NOTE — Progress Notes (Signed)
Subjective:   Patient ID: Deanna Bell, female    DOB: 1955-05-09, 62 y.o.   MRN: [id number]  Deanna Bell is a pleasant 62 y.o. year old female who presents to clinic today with Annual Exam (Patient is here today for a CPE.  Last PAP per Dr. Marylu Lund on 1.28.19 and Mammogram on 6.8.19. WNL ordered by her as well.  Cologuard on 7.21.17 was negative next due 2.12.20.  Immunizations are UTD.  She is currently fasting and would like her labs mailed to her.)  on 12/12/2017  HPI:  Here for CPX. Has GYN- Dr. Marvel Plan. Last pap smear 05/07/17 and mammogram 09/15/17.  Both normal. Cologuard neg 10/29/15, next due 05/22/18.  Sees dermatologist twice yearly, Dr. Derrel Nip.  Current Outpatient Medications on File Prior to Visit  Medication Sig Dispense Refill  . fluorouracil (EFUDEX) 5 % cream 1 APPLICATION APPLY ON THE SKIN NIGHTLY  0  . ibuprofen (ADVIL,MOTRIN) 200 MG tablet Take 200 mg by mouth every 6 (six) hours as needed. Reported on 04/13/2015     No current facility-administered medications on file prior to visit.     No Known Allergies  No past medical history on file.  No past surgical history on file.  Family History  Problem Relation Age of Onset  . Cancer Mother 56       throat and breast     Social History   Socioeconomic History  . Marital status: Single    Spouse name: Not on file  . Number of children: 1  . Years of education: Not on file  . Highest education level: Not on file  Occupational History    Employer: unemployed  Social Needs  . Financial resource strain: Not on file  . Food insecurity:    Worry: Not on file    Inability: Not on file  . Transportation needs:    Medical: Not on file    Non-medical: Not on file  Tobacco Use  . Smoking status: Current Every Day Smoker  . Smokeless tobacco: Never Used  . Tobacco comment: Down to 6 cigs a day, trying to quit   Substance and Sexual Activity  . Alcohol use: No  . Drug use: No  . Sexual  activity: Not on file  Lifestyle  . Physical activity:    Days per week: Not on file    Minutes per session: Not on file  . Stress: Not on file  Relationships  . Social connections:    Talks on phone: Not on file    Gets together: Not on file    Attends religious service: Not on file    Active member of club or organization: Not on file    Attends meetings of clubs or organizations: Not on file    Relationship status: Not on file  . Intimate partner violence:    Fear of current or ex partner: Not on file    Emotionally abused: Not on file    Physically abused: Not on file    Forced sexual activity: Not on file  Other Topics Concern  . Not on file  Social History Narrative  . Not on file   The PMH, PSH, Social History, Family History, Medications, and allergies have been reviewed in [facility], and have been updated if relevant.   Review of Systems  Constitutional: Negative.   HENT: Negative.   Eyes: Negative.   Respiratory: Negative.   Cardiovascular: Negative.   Gastrointestinal: Negative.   Endocrine: Negative.  Genitourinary: Negative.   Musculoskeletal: Negative.   Skin: Negative.   Allergic/Immunologic: Negative.   Neurological: Negative.   Hematological: Negative.   Psychiatric/Behavioral: Negative.   All other systems reviewed and are negative.      Objective:    BP 132/66 (BP Location: Left Arm, Patient Position: Sitting, Cuff Size: Normal)   Pulse 81   Temp 99.1 F (37.3 C) (Oral)   Ht 5' 1.25" (1.556 m)   Wt 139 lb (63 kg)   SpO2 97%   BMI 26.05 kg/m    Physical Exam    General:  Well-developed,well-nourished,in no acute distress; alert,appropriate and cooperative throughout examination Head:  normocephalic and atraumatic.   Eyes:  vision grossly intact, PERRL Ears:  R ear normal and L ear normal externally, TMs clear bilaterally Nose:  no external deformity.   Mouth:  good dentition.   Neck:  No deformities, masses, or tenderness  noted. Lungs:  Normal respiratory effort, chest expands symmetrically. Lungs are clear to auscultation, no crackles or wheezes. Heart:  Normal rate and regular rhythm. S1 and S2 normal without gallop, murmur, click, rub or other extra sounds. Abdomen:  Bowel sounds positive,abdomen soft and non-tender without masses, organomegaly or hernias noted. Msk:  No deformity or scoliosis noted of thoracic or lumbar spine.   Extremities:  No clubbing, cyanosis, edema, or deformity noted with normal full range of motion of all joints.   Neurologic:  alert & oriented X3 and gait normal.   Skin:  Intact without suspicious lesions or rashes Cervical Nodes:  No lymphadenopathy noted Axillary Nodes:  No palpable lymphadenopathy Psych:  Cognition and judgment appear intact. Alert and cooperative with normal attention span and concentration. No apparent delusions, illusions, hallucinations      Assessment & Plan:   Well woman exam - Plan: Comp Met (CMET), TSH, CBC w/Diff, Lipid Profile  Hyperlipidemia, unspecified hyperlipidemia type - Plan: Comp Met (CMET), TSH, Lipid Profile No follow-ups on file.

## 2017-12-12 NOTE — Patient Instructions (Signed)
Great to see you. I will call you with your lab results from today and you can view them online.   

## 2017-12-19 ENCOUNTER — Encounter: Payer: BLUE CROSS/BLUE SHIELD | Admitting: Family Medicine

## 2018-02-26 DIAGNOSIS — L853 Xerosis cutis: Secondary | ICD-10-CM | POA: Diagnosis not present

## 2018-02-26 DIAGNOSIS — L570 Actinic keratosis: Secondary | ICD-10-CM | POA: Diagnosis not present

## 2018-02-26 DIAGNOSIS — Z85828 Personal history of other malignant neoplasm of skin: Secondary | ICD-10-CM | POA: Diagnosis not present

## 2018-05-14 DIAGNOSIS — Z01419 Encounter for gynecological examination (general) (routine) without abnormal findings: Secondary | ICD-10-CM | POA: Diagnosis not present

## 2018-05-14 DIAGNOSIS — Z1389 Encounter for screening for other disorder: Secondary | ICD-10-CM | POA: Diagnosis not present

## 2018-05-14 DIAGNOSIS — Z6826 Body mass index (BMI) 26.0-26.9, adult: Secondary | ICD-10-CM | POA: Diagnosis not present

## 2018-05-14 DIAGNOSIS — Z720 Tobacco use: Secondary | ICD-10-CM | POA: Diagnosis not present

## 2018-05-17 ENCOUNTER — Encounter: Payer: Self-pay | Admitting: Family Medicine

## 2018-10-14 DIAGNOSIS — Z803 Family history of malignant neoplasm of breast: Secondary | ICD-10-CM | POA: Diagnosis not present

## 2018-10-14 DIAGNOSIS — Z1231 Encounter for screening mammogram for malignant neoplasm of breast: Secondary | ICD-10-CM | POA: Diagnosis not present

## 2018-12-17 ENCOUNTER — Encounter: Payer: BLUE CROSS/BLUE SHIELD | Admitting: Family Medicine

## 2018-12-22 NOTE — Progress Notes (Signed)
Subjective:   Patient ID: Deanna Bell, female    DOB: 11/05/1955, 63 y.o.   MRN: [id number]  Deanna Bell is a pleasant 63 y.o. year old female who presents to clinic today with Annual Exam  on 12/24/2018  HPI:  Health Maintenance  Topic Date Due  . Hepatitis C Screening  1955/04/20  . HIV Screening  04/27/1970  . Fecal DNA (Cologuard)  10/20/2018  . INFLUENZA VACCINE  11/09/2018  . MAMMOGRAM  09/29/2019  . TETANUS/TDAP  01/06/2020  . PAP SMEAR-Modifier  05/07/2020   Tobacco abuse- cut back to 6 per day.  Here for CPX. Has GYN- Dr. Marvel Plan. Last pap smear 05/07/17 and mammogram 6/20.  Both normal. Cologuard neg 10/29/15, next due 10/20/18.  Sees dermatologist twice yearly, Dr. Derrel Nip.  Seeing her in October.  Negative cologuard 10/20/15- due for repeat cologuard.  Health Maintenance  Topic Date Due  . Hepatitis C Screening  08-18-1955  . HIV Screening  04/27/1970  . Fecal DNA (Cologuard)  10/20/2018  . INFLUENZA VACCINE  11/09/2018  . MAMMOGRAM  09/29/2019  . TETANUS/TDAP  01/06/2020  . PAP SMEAR-Modifier  05/07/2020   HLD-  Lab Results  Component Value Date   CHOL 217 (H) 12/12/2017   HDL 56.90 12/12/2017   LDLCALC 139 (H) 12/12/2017   TRIG 104.0 12/12/2017   CHOLHDL 4 12/12/2017   The 10-year ASCVD risk score Mikey Bussing DC Jr., et al., 2013) is: 4.6%   Values used to calculate the score:     Age: 63 years     Sex: Female     Is Non-Hispanic African American: No     Diabetic: No     Tobacco smoker: Yes     Systolic Blood Pressure: 90 mmHg     Is BP treated: No     HDL Cholesterol: 56.9 mg/dL     Total Cholesterol: 217 mg/dL Lab Results  Component Value Date   ALT 11 12/12/2017   AST 19 12/12/2017   ALKPHOS 58 12/12/2017   BILITOT 0.5 12/12/2017   Lab Results  Component Value Date   NA 141 12/12/2017   K 4.5 12/12/2017   CL 104 12/12/2017   CO2 30 12/12/2017   Lab Results  Component Value Date   CREATININE 0.92 12/12/2017    Current  Outpatient Medications on File Prior to Visit  Medication Sig Dispense Refill  . fluorouracil (EFUDEX) 5 % cream 1 APPLICATION APPLY ON THE SKIN NIGHTLY  0  . ibuprofen (ADVIL,MOTRIN) 200 MG tablet Take 200 mg by mouth every 6 (six) hours as needed. Reported on 04/13/2015     No current facility-administered medications on file prior to visit.     No Known Allergies  History reviewed. No pertinent past medical history.  History reviewed. No pertinent surgical history.  Family History  Problem Relation Age of Onset  . Cancer Mother 59       throat and breast     Social History   Socioeconomic History  . Marital status: Single    Spouse name: Not on file  . Number of children: 1  . Years of education: Not on file  . Highest education level: Not on file  Occupational History    Employer: unemployed  Social Needs  . Financial resource strain: Not on file  . Food insecurity    Worry: Not on file    Inability: Not on file  . Transportation needs    Medical: Not on file  Non-medical: Not on file  Tobacco Use  . Smoking status: Current Every Day Smoker  . Smokeless tobacco: Never Used  . Tobacco comment: Down to 6 cigs a day, trying to quit   Substance and Sexual Activity  . Alcohol use: No  . Drug use: No  . Sexual activity: Not on file  Lifestyle  . Physical activity    Days per week: Not on file    Minutes per session: Not on file  . Stress: Not on file  Relationships  . Social Herbalist on phone: Not on file    Gets together: Not on file    Attends religious service: Not on file    Active member of club or organization: Not on file    Attends meetings of clubs or organizations: Not on file    Relationship status: Not on file  . Intimate partner violence    Fear of current or ex partner: Not on file    Emotionally abused: Not on file    Physically abused: Not on file    Forced sexual activity: Not on file  Other Topics Concern  . Not on file   Social History Narrative  . Not on file   The PMH, PSH, Social History, Family History, Medications, and allergies have been reviewed in [facility], and have been updated if relevant.   Review of Systems  Constitutional: Negative.   HENT: Negative.   Eyes: Negative.   Respiratory: Negative.   Cardiovascular: Negative.   Gastrointestinal: Negative.   Endocrine: Negative.   Genitourinary: Negative.   Musculoskeletal: Negative.   Skin: Negative.   Allergic/Immunologic: Negative.   Neurological: Negative.   Hematological: Negative.   Psychiatric/Behavioral: Negative.   All other systems reviewed and are negative.      Objective:    BP 90/62   Pulse 69   Temp (!) 97.4 F (36.3 C)   Ht 5\' 2"  (1.575 m)   Wt 133 lb (60.3 kg)   SpO2 96%   BMI 24.33 kg/m    Physical Exam    General:  Well-developed,well-nourished,in no acute distress; alert,appropriate and cooperative throughout examination Head:  normocephalic and atraumatic.   Eyes:  vision grossly intact, PERRL Ears:  R ear normal and L ear normal externally, TMs clear bilaterally Nose:  no external deformity.   Mouth:  good dentition.   Neck:  No deformities, masses, or tenderness noted. Lungs:  Normal respiratory effort, chest expands symmetrically. Lungs are clear to auscultation, no crackles or wheezes. Heart:  Normal rate and regular rhythm. S1 and S2 normal without gallop, murmur, click, rub or other extra sounds. Abdomen:  Bowel sounds positive,abdomen soft and non-tender without masses, organomegaly or hernias noted. Msk:  No deformity or scoliosis noted of thoracic or lumbar spine.   Extremities:  No clubbing, cyanosis, edema, or deformity noted with normal full range of motion of all joints.   Neurologic:  alert & oriented X3 and gait normal.   Skin:  Intact without suspicious lesions or rashes Cervical Nodes:  No lymphadenopathy noted Axillary Nodes:  No palpable lymphadenopathy Psych:  Cognition and judgment  appear intact. Alert and cooperative with normal attention span and concentration. No apparent delusions, illusions, hallucinations      Assessment & Plan:   Well woman exam without gynecological exam  Hyperlipidemia, unspecified hyperlipidemia type - Plan: Comprehensive metabolic panel, Lipid panel, CBC with Differential/Platelet  TOBACCO USER  Need for hepatitis C screening test - Plan: Hepatitis C Antibody  Screening for HIV (human immunodeficiency virus) - Plan: HIV antibody (with reflex)  Screening for colon cancer - Plan: Cologuard  Need for immunization against influenza - Plan: Flu Vaccine QUAD 36+ mos IM No follow-ups on file.

## 2018-12-24 ENCOUNTER — Encounter: Payer: Self-pay | Admitting: Family Medicine

## 2018-12-24 ENCOUNTER — Ambulatory Visit (INDEPENDENT_AMBULATORY_CARE_PROVIDER_SITE_OTHER): Payer: BC Managed Care – PPO | Admitting: Family Medicine

## 2018-12-24 VITALS — BP 90/62 | HR 69 | Temp 97.4°F | Ht 62.0 in | Wt 133.0 lb

## 2018-12-24 DIAGNOSIS — E785 Hyperlipidemia, unspecified: Secondary | ICD-10-CM

## 2018-12-24 DIAGNOSIS — Z114 Encounter for screening for human immunodeficiency virus [HIV]: Secondary | ICD-10-CM | POA: Diagnosis not present

## 2018-12-24 DIAGNOSIS — Z1159 Encounter for screening for other viral diseases: Secondary | ICD-10-CM

## 2018-12-24 DIAGNOSIS — F17200 Nicotine dependence, unspecified, uncomplicated: Secondary | ICD-10-CM

## 2018-12-24 DIAGNOSIS — Z23 Encounter for immunization: Secondary | ICD-10-CM

## 2018-12-24 DIAGNOSIS — Z1211 Encounter for screening for malignant neoplasm of colon: Secondary | ICD-10-CM

## 2018-12-24 DIAGNOSIS — Z0000 Encounter for general adult medical examination without abnormal findings: Secondary | ICD-10-CM

## 2018-12-24 LAB — COMPREHENSIVE METABOLIC PANEL
ALT: 11 U/L (ref 0–35)
AST: 21 U/L (ref 0–37)
Albumin: 4.4 g/dL (ref 3.5–5.2)
Alkaline Phosphatase: 63 U/L (ref 39–117)
BUN: 18 mg/dL (ref 6–23)
CO2: 30 mEq/L (ref 19–32)
Calcium: 10.1 mg/dL (ref 8.4–10.5)
Chloride: 102 mEq/L (ref 96–112)
Creatinine, Ser: 0.85 mg/dL (ref 0.40–1.20)
GFR: 67.41 mL/min (ref >60.00)
Glucose, Bld: 92 mg/dL (ref 70–99)
Potassium: 5.5 mEq/L — ABNORMAL HIGH (ref 3.5–5.1)
Sodium: 140 mEq/L (ref 135–145)
Total Bilirubin: 0.5 mg/dL (ref 0.2–1.2)
Total Protein: 7.8 g/dL (ref 6.0–8.3)

## 2018-12-24 LAB — CBC WITH DIFFERENTIAL/PLATELET
Basophils Absolute: 0 10*3/uL (ref 0.0–0.1)
Basophils Relative: 0.6 % (ref 0.0–3.0)
Eosinophils Absolute: 0.1 10*3/uL (ref 0.0–0.7)
Eosinophils Relative: 1.3 % (ref 0.0–5.0)
HCT: 38.9 % (ref 36.0–46.0)
Hemoglobin: 13.1 g/dL (ref 12.0–15.0)
Lymphocytes Relative: 30.8 % (ref 12.0–46.0)
Lymphs Abs: 1.9 10*3/uL (ref 0.7–4.0)
MCHC: 33.7 g/dL (ref 30.0–36.0)
MCV: 92.5 fl (ref 78.0–100.0)
Monocytes Absolute: 0.3 10*3/uL (ref 0.1–1.0)
Monocytes Relative: 5.1 % (ref 3.0–12.0)
Neutro Abs: 3.9 10*3/uL (ref 1.4–7.7)
Neutrophils Relative %: 62.2 % (ref 43.0–77.0)
Platelets: 192 10*3/uL (ref 150.0–400.0)
RBC: 4.2 Mil/uL (ref 3.87–5.11)
RDW: 13.4 % (ref 11.5–15.5)
WBC: 6.3 10*3/uL (ref 4.0–10.5)

## 2018-12-24 LAB — LIPID PANEL
Cholesterol: 222 mg/dL — ABNORMAL HIGH (ref 0–200)
HDL: 59.7 mg/dL (ref >39.00)
LDL Cholesterol: 145 mg/dL — ABNORMAL HIGH (ref 0–99)
NonHDL: 162.13
Total CHOL/HDL Ratio: 4
Triglycerides: 86 mg/dL (ref 0.0–149.0)
VLDL: 17.2 mg/dL (ref 0.0–40.0)

## 2018-12-24 NOTE — Assessment & Plan Note (Signed)
Reviewed preventive care protocols, scheduled due services, and updated immunizations Discussed nutrition, exercise, diet, and healthy lifestyle.  Cologuard ordered.

## 2018-12-24 NOTE — Assessment & Plan Note (Signed)
Due for labs today. Orders Placed This Encounter  Procedures  . Flu Vaccine QUAD 36+ mos IM  . Hepatitis C Antibody  . HIV antibody (with reflex)  . Cologuard  . Comprehensive metabolic panel  . Lipid panel  . CBC with Differential/Platelet

## 2018-12-24 NOTE — Assessment & Plan Note (Signed)
The patient was counseled on the dangers of tobacco use, and was advised to quit.  Reviewed strategies to maximize success, including removing cigarettes and smoking materials from environment. 

## 2018-12-24 NOTE — Patient Instructions (Addendum)
Great to see you. I will call you with your lab results from today and you can view them online.   Consider, patches, gums, losenges for nicotine replacement.  cologuard will come to your come to your house.

## 2018-12-25 LAB — HEPATITIS C ANTIBODY
Hepatitis C Ab: NONREACTIVE
SIGNAL TO CUT-OFF: 0.01 (ref <1.00)

## 2018-12-25 LAB — ALPHA-1-ANTITRYPSIN: A-1 Antitrypsin, Ser: 154 mg/dL (ref 83–199)

## 2018-12-25 LAB — HIV ANTIBODY (ROUTINE TESTING W REFLEX): HIV 1&2 Ab, 4th Generation: NONREACTIVE

## 2018-12-31 ENCOUNTER — Encounter: Payer: Self-pay | Admitting: Family Medicine

## 2018-12-31 DIAGNOSIS — Z1211 Encounter for screening for malignant neoplasm of colon: Secondary | ICD-10-CM | POA: Diagnosis not present

## 2018-12-31 DIAGNOSIS — Z1212 Encounter for screening for malignant neoplasm of rectum: Secondary | ICD-10-CM | POA: Diagnosis not present

## 2019-01-01 LAB — COLOGUARD

## 2019-01-02 ENCOUNTER — Other Ambulatory Visit: Payer: Self-pay

## 2019-01-02 DIAGNOSIS — E875 Hyperkalemia: Secondary | ICD-10-CM

## 2019-01-03 ENCOUNTER — Other Ambulatory Visit: Payer: Self-pay

## 2019-01-03 ENCOUNTER — Other Ambulatory Visit (INDEPENDENT_AMBULATORY_CARE_PROVIDER_SITE_OTHER): Payer: BC Managed Care – PPO

## 2019-01-03 DIAGNOSIS — E875 Hyperkalemia: Secondary | ICD-10-CM

## 2019-01-03 LAB — BASIC METABOLIC PANEL
BUN: 11 mg/dL (ref 6–23)
CO2: 30 mEq/L (ref 19–32)
Calcium: 9.3 mg/dL (ref 8.4–10.5)
Chloride: 102 mEq/L (ref 96–112)
Creatinine, Ser: 0.8 mg/dL (ref 0.40–1.20)
GFR: 72.29 mL/min (ref >60.00)
Glucose, Bld: 81 mg/dL (ref 70–99)
Potassium: 3.8 mEq/L (ref 3.5–5.1)
Sodium: 140 mEq/L (ref 135–145)

## 2019-01-06 NOTE — Progress Notes (Signed)
Pt requested copy to be printed and mailed to her/thx dmf

## 2019-01-09 DIAGNOSIS — L570 Actinic keratosis: Secondary | ICD-10-CM | POA: Diagnosis not present

## 2019-01-09 DIAGNOSIS — L814 Other melanin hyperpigmentation: Secondary | ICD-10-CM | POA: Diagnosis not present

## 2019-01-09 DIAGNOSIS — D0461 Carcinoma in situ of skin of right upper limb, including shoulder: Secondary | ICD-10-CM | POA: Diagnosis not present

## 2019-01-09 DIAGNOSIS — D1801 Hemangioma of skin and subcutaneous tissue: Secondary | ICD-10-CM | POA: Diagnosis not present

## 2019-01-09 DIAGNOSIS — Z85828 Personal history of other malignant neoplasm of skin: Secondary | ICD-10-CM | POA: Diagnosis not present

## 2019-01-09 DIAGNOSIS — L821 Other seborrheic keratosis: Secondary | ICD-10-CM | POA: Diagnosis not present

## 2019-01-17 DIAGNOSIS — D0461 Carcinoma in situ of skin of right upper limb, including shoulder: Secondary | ICD-10-CM | POA: Diagnosis not present

## 2019-05-20 DIAGNOSIS — Z6825 Body mass index (BMI) 25.0-25.9, adult: Secondary | ICD-10-CM | POA: Diagnosis not present

## 2019-05-20 DIAGNOSIS — Z1389 Encounter for screening for other disorder: Secondary | ICD-10-CM | POA: Diagnosis not present

## 2019-05-20 DIAGNOSIS — Z01419 Encounter for gynecological examination (general) (routine) without abnormal findings: Secondary | ICD-10-CM | POA: Diagnosis not present

## 2019-06-02 ENCOUNTER — Encounter: Payer: Self-pay | Admitting: Family Medicine

## 2019-06-02 ENCOUNTER — Ambulatory Visit (INDEPENDENT_AMBULATORY_CARE_PROVIDER_SITE_OTHER): Payer: BC Managed Care – PPO | Admitting: Family Medicine

## 2019-06-02 ENCOUNTER — Other Ambulatory Visit: Payer: Self-pay

## 2019-06-02 VITALS — BP 108/70 | HR 71 | Temp 97.7°F | Ht 62.0 in | Wt 135.0 lb

## 2019-06-02 DIAGNOSIS — E785 Hyperlipidemia, unspecified: Secondary | ICD-10-CM | POA: Diagnosis not present

## 2019-06-02 DIAGNOSIS — F17200 Nicotine dependence, unspecified, uncomplicated: Secondary | ICD-10-CM | POA: Diagnosis not present

## 2019-06-02 NOTE — Assessment & Plan Note (Signed)
Not ready to quit. Discussed that she may be eligible for lung cancer screening. Declined referral at this time but will consider.

## 2019-06-02 NOTE — Patient Instructions (Signed)
#   Tobacco use - continue to work on cutting back - consider lung cancer screening -- call the office if you decide you want to do this  # Cholesterol - continue to exercise - we will check this next year

## 2019-06-02 NOTE — Progress Notes (Signed)
Subjective:     Deanna Bell is a 64 y.o. female presenting for New Patient (Initial Visit)     HPI   #tobacco use - did smoke up to 1/2 ppd - is cutting back but not ready to completely quit - has never done lung cancer screening, not sure she wants to do this   Review of Systems   Social History   Tobacco Use  Smoking Status Current Every Day Smoker  . Packs/day: 0.50  . Years: 35.00  . Pack years: 17.50  Smokeless Tobacco Never Used  Tobacco Comment   Down to 4-5 cigs a day, trying to quit         Objective:    BP Readings from Last 3 Encounters:  06/02/19 108/70  12/24/18 90/62  12/12/17 132/66   Wt Readings from Last 3 Encounters:  06/02/19 135 lb (61.2 kg)  12/24/18 133 lb (60.3 kg)  12/12/17 139 lb (63 kg)    BP 108/70 (BP Location: Left Arm, Patient Position: Sitting, Cuff Size: Normal)   Pulse 71   Temp 97.7 F (36.5 C) (Temporal)   Ht 5\' 2"  (1.575 m)   Wt 135 lb (61.2 kg)   SpO2 97%   BMI 24.69 kg/m    Physical Exam Constitutional:      General: She is not in acute distress.    Appearance: She is well-developed. She is not diaphoretic.  HENT:     Right Ear: External ear normal.     Left Ear: External ear normal.     Nose: Nose normal.  Eyes:     Conjunctiva/sclera: Conjunctivae normal.  Cardiovascular:     Rate and Rhythm: Normal rate and regular rhythm.     Heart sounds: No murmur.  Pulmonary:     Effort: Pulmonary effort is normal. No respiratory distress.     Breath sounds: Normal breath sounds. No wheezing.  Musculoskeletal:     Cervical back: Neck supple.  Skin:    General: Skin is warm and dry.     Capillary Refill: Capillary refill takes less than 2 seconds.  Neurological:     Mental Status: She is alert. Mental status is at baseline.  Psychiatric:        Mood and Affect: Mood normal.        Behavior: Behavior normal.      The 10-year ASCVD risk score Mikey Bussing DC Jr., et al., 2013) is: 7%   Values used to  calculate the score:     Age: 44 years     Sex: Female     Is Non-Hispanic African American: No     Diabetic: No     Tobacco smoker: Yes     Systolic Blood Pressure: 123XX123 mmHg     Is BP treated: No     HDL Cholesterol: 59.7 mg/dL     Total Cholesterol: 222 mg/dL      Assessment & Plan:   Problem List Items Addressed This Visit      Other   TOBACCO USER - Primary    Not ready to quit. Discussed that she may be eligible for lung cancer screening. Declined referral at this time but will consider.       HLD (hyperlipidemia)    Reviewed ASCVD risk of 7% below treatment but anticipate that next year will likely reach threshold. Encouraged continued exercise and tobacco cessation.           Return in about 1 year (around 06/01/2020).  Lesleigh Noe, MD

## 2019-06-02 NOTE — Assessment & Plan Note (Signed)
Reviewed ASCVD risk of 7% below treatment but anticipate that next year will likely reach threshold. Encouraged continued exercise and tobacco cessation.

## 2019-07-15 DIAGNOSIS — Z85828 Personal history of other malignant neoplasm of skin: Secondary | ICD-10-CM | POA: Diagnosis not present

## 2019-07-15 DIAGNOSIS — L821 Other seborrheic keratosis: Secondary | ICD-10-CM | POA: Diagnosis not present

## 2019-07-15 DIAGNOSIS — L570 Actinic keratosis: Secondary | ICD-10-CM | POA: Diagnosis not present

## 2019-07-15 DIAGNOSIS — D225 Melanocytic nevi of trunk: Secondary | ICD-10-CM | POA: Diagnosis not present

## 2019-10-20 DIAGNOSIS — Z1231 Encounter for screening mammogram for malignant neoplasm of breast: Secondary | ICD-10-CM | POA: Diagnosis not present

## 2019-10-20 LAB — HM MAMMOGRAPHY

## 2019-10-29 ENCOUNTER — Encounter: Payer: Self-pay | Admitting: Family Medicine

## 2019-11-13 ENCOUNTER — Encounter: Payer: Self-pay | Admitting: Family Medicine

## 2020-01-01 ENCOUNTER — Ambulatory Visit (INDEPENDENT_AMBULATORY_CARE_PROVIDER_SITE_OTHER): Payer: BC Managed Care – PPO | Admitting: Family Medicine

## 2020-01-01 ENCOUNTER — Other Ambulatory Visit: Payer: Self-pay

## 2020-01-01 VITALS — BP 130/68 | HR 69 | Temp 97.6°F | Ht 60.75 in | Wt 136.2 lb

## 2020-01-01 DIAGNOSIS — Z23 Encounter for immunization: Secondary | ICD-10-CM | POA: Diagnosis not present

## 2020-01-01 DIAGNOSIS — E785 Hyperlipidemia, unspecified: Secondary | ICD-10-CM | POA: Diagnosis not present

## 2020-01-01 DIAGNOSIS — Z0000 Encounter for general adult medical examination without abnormal findings: Secondary | ICD-10-CM | POA: Diagnosis not present

## 2020-01-01 DIAGNOSIS — F17200 Nicotine dependence, unspecified, uncomplicated: Secondary | ICD-10-CM | POA: Diagnosis not present

## 2020-01-01 LAB — COMPREHENSIVE METABOLIC PANEL
ALT: 11 U/L (ref 0–35)
AST: 21 U/L (ref 0–37)
Albumin: 4.5 g/dL (ref 3.5–5.2)
Alkaline Phosphatase: 61 U/L (ref 39–117)
BUN: 14 mg/dL (ref 6–23)
CO2: 31 mEq/L (ref 19–32)
Calcium: 9.5 mg/dL (ref 8.4–10.5)
Chloride: 100 mEq/L (ref 96–112)
Creatinine, Ser: 0.88 mg/dL (ref 0.40–1.20)
GFR: 64.55 mL/min (ref >60.00)
Glucose, Bld: 93 mg/dL (ref 70–99)
Potassium: 4.1 mEq/L (ref 3.5–5.1)
Sodium: 138 mEq/L (ref 135–145)
Total Bilirubin: 0.4 mg/dL (ref 0.2–1.2)
Total Protein: 7.7 g/dL (ref 6.0–8.3)

## 2020-01-01 LAB — LIPID PANEL
Cholesterol: 232 mg/dL — ABNORMAL HIGH (ref 0–200)
HDL: 57.8 mg/dL (ref >39.00)
LDL Cholesterol: 150 mg/dL — ABNORMAL HIGH (ref 0–99)
NonHDL: 174.04
Total CHOL/HDL Ratio: 4
Triglycerides: 122 mg/dL (ref 0.0–149.0)
VLDL: 24.4 mg/dL (ref 0.0–40.0)

## 2020-01-01 NOTE — Progress Notes (Signed)
Annual Exam   Chief Complaint:  Chief Complaint  Patient presents with  . Annual Exam    no concerns     History of Present Illness:  Ms. Deanna Bell is a 64 y.o. No obstetric history on file. who LMP was No LMP recorded. Patient is postmenopausal., presents today for her annual examination.     Nutrition She does get adequate calcium and Vitamin D in her diet. Diet: eats leafy greens Exercise: walking 30 minutes every afternoon, and swimming  Safety The patient wears seatbelts: yes.     The patient feels safe at home and in their relationships: yes.    GYN She is single partner, contraception - post menopausal status.    Cervical Cancer Screening (21-65):   Last Pap:  2019 Results were: no abnormalities /neg HPV DNA unknown  Breast Cancer Screening (Age 59-74):  There is no FH of breast cancer. There is no FH of ovarian cancer. BRCA screening Not Indicated.  Last Mammogram: 10/2019 The patient does want a mammogram this year.    Colon Cancer Screening:  Age 54-75 yo - benefits outweigh the risk. Adults 40-85 yo who have never been screened benefit.  Benefits: 134000 people in 2016 will be diagnosed and 49,000 will die - early detection helps Harms: Complications 2/2 to colonoscopy High Risk (Colonoscopy): genetic disorder (Lynch syndrome or familial adenomatous polyposis), personal hx of IBD, previous adenomatous polyp, or previous colorectal cancer, FamHx start 10 years before the age at diagnosis, increased in males and black race  Options:  FIT - looks for hemoglobin (blood in the stool) - specific and fairly sensitive - must be done annually Cologuard - looks for DNA and blood - more sensitive - therefore can have more false positives, every 3 years Colonoscopy - every 10 years if normal - sedation, bowl prep, must have someone drive you  Shared decision making and the patient had decided to do Cologuard - 12/31/2018.   Social History   Tobacco Use   Smoking Status Current Every Day Smoker  . Packs/day: 0.50  . Years: 35.00  . Pack years: 17.50  Smokeless Tobacco Never Used  Tobacco Comment   Down to 4-5 cigs a day, trying to quit     Lung Cancer Screening (Ages 75-80): no 20 year pack history? No Current Tobacco user? Yes Quit less than 15 years ago? No Interested in low dose CT for lung cancer screening? no  Weight Wt Readings from Last 3 Encounters:  01/01/20 136 lb 4 oz (61.8 kg)  06/02/19 135 lb (61.2 kg)  12/24/18 133 lb (60.3 kg)   Patient has normal BMI  BMI Readings from Last 1 Encounters:  01/01/20 25.96 kg/m     Chronic disease screening Blood pressure monitoring:  BP Readings from Last 3 Encounters:  01/01/20 130/68  06/02/19 108/70  12/24/18 90/62    Lipid Monitoring: Indication for screening: age >52, obesity, diabetes, family hx, CV risk factors.  Lipid screening: Yes  Lab Results  Component Value Date   CHOL 232 (H) 01/01/2020   HDL 57.80 01/01/2020   LDLCALC 150 (H) 01/01/2020   TRIG 122.0 01/01/2020   CHOLHDL 4 01/01/2020     Diabetes Screening: age >80, overweight, family hx, PCOS, hx of gestational diabetes, at risk ethnicity Diabetes Screening screening: Yes  No results found for: HGBA1C   No past medical history on file.  No past surgical history on file.  Prior to Admission medications   Medication Sig Start Date  End Date Taking? Authorizing Provider  ibuprofen (ADVIL,MOTRIN) 200 MG tablet Take 200 mg by mouth every 6 (six) hours as needed. Reported on 04/13/2015   Yes [provider]    No Known Allergies  Gynecologic History: No LMP recorded. Patient is postmenopausal.  Obstetric History: No obstetric history on file.  Social History   Socioeconomic History  . Marital status: Married    Spouse name: Loraine Leriche  . Number of children: 1  . Years of education: high school  . Highest education level: Not on file  Occupational History    Employer: unemployed   Tobacco Use  . Smoking status: Current Every Day Smoker    Packs/day: 0.50    Years: 35.00    Pack years: 17.50  . Smokeless tobacco: Never Used  . Tobacco comment: Down to 4-5 cigs a day, trying to quit   Substance and Sexual Activity  . Alcohol use: No  . Drug use: No  . Sexual activity: Not Currently    Birth control/protection: Post-menopausal  Other Topics Concern  . Not on file  Social History Narrative   06/02/19   From: the area   Living: Husband, Loraine Leriche   Work: retired, was a Land      Family: Son - Christiane Ha - 3 grandchildren who live nearby      Enjoys: yardwork, gardening      Exercise: workouts in the morning 10-30 minutes, has knee pain, walks 30 minutes for her dog   Diet: meat, veggies, a lot of sweets      Safety   Seat belts: Yes    Guns: Yes  and secure   Safe in relationships: Yes    Social Determinants of Health   Financial Resource Strain:   . Difficulty of Paying Living Expenses: Not on file  Food Insecurity:   . Worried About Programme researcher, broadcasting/film/video in the Last Year: Not on file  . Ran Out of Food in the Last Year: Not on file  Transportation Needs:   . Lack of Transportation (Medical): Not on file  . Lack of Transportation (Non-Medical): Not on file  Physical Activity:   . Days of Exercise per Week: Not on file  . Minutes of Exercise per Session: Not on file  Stress:   . Feeling of Stress : Not on file  Social Connections:   . Frequency of Communication with Friends and Family: Not on file  . Frequency of Social Gatherings with Friends and Family: Not on file  . Attends Religious Services: Not on file  . Active Member of Clubs or Organizations: Not on file  . Attends Banker Meetings: Not on file  . Marital Status: Not on file  Intimate Partner Violence:   . Fear of Current or Ex-Partner: Not on file  . Emotionally Abused: Not on file  . Physically Abused: Not on file  . Sexually Abused: Not on file    Family  History  Problem Relation Age of Onset  . Breast cancer Mother 39  . Throat cancer Mother     Review of Systems  Constitutional: Negative for chills and fever.  HENT: Negative for congestion and sore throat.   Eyes: Negative for blurred vision and double vision.  Respiratory: Negative for shortness of breath.   Cardiovascular: Negative for chest pain.  Gastrointestinal: Negative for heartburn, nausea and vomiting.  Genitourinary: Negative.   Musculoskeletal: Negative.  Negative for myalgias.  Skin: Negative for rash.  Neurological: Negative for dizziness  and headaches.  Endo/Heme/Allergies: Does not bruise/bleed easily.  Psychiatric/Behavioral: Negative for depression. The patient is not nervous/anxious.      Physical Exam BP 130/68   Pulse 69   Temp 97.6 F (36.4 C) (Temporal)   Ht 5' 0.75" (1.543 m)   Wt 136 lb 4 oz (61.8 kg)   SpO2 100%   BMI 25.96 kg/m    BP Readings from Last 3 Encounters:  01/01/20 130/68  06/02/19 108/70  12/24/18 90/62    Physical Exam Constitutional:      General: She is not in acute distress.    Appearance: She is well-developed. She is not diaphoretic.  HENT:     Head: Normocephalic and atraumatic.     Right Ear: External ear normal.     Left Ear: External ear normal.     Nose: Nose normal.  Eyes:     General: No scleral icterus.    Conjunctiva/sclera: Conjunctivae normal.  Cardiovascular:     Rate and Rhythm: Normal rate and regular rhythm.     Heart sounds: No murmur heard.   Pulmonary:     Effort: Pulmonary effort is normal. No respiratory distress.     Breath sounds: Normal breath sounds. No wheezing.  Abdominal:     General: Bowel sounds are normal. There is no distension.     Palpations: Abdomen is soft. There is no mass.     Tenderness: There is no abdominal tenderness. There is no guarding or rebound.  Musculoskeletal:        General: Normal range of motion.     Cervical back: Neck supple.  Lymphadenopathy:      Cervical: No cervical adenopathy.  Skin:    General: Skin is warm and dry.     Capillary Refill: Capillary refill takes less than 2 seconds.  Neurological:     Mental Status: She is alert and oriented to person, place, and time.     Deep Tendon Reflexes: Reflexes normal.  Psychiatric:        Behavior: Behavior normal.     Results:  PHQ-9:   Depression screen [facility] 2/9 01/01/2020 12/24/2018 12/12/2017  Decreased Interest 0 0 0  Down, Depressed, Hopeless 0 0 0  PHQ - 2 Score 0 0 0      Assessment: 64 y.o. No obstetric history on file. female here for routine annual physical examination.  Plan: Problem List Items Addressed This Visit      Other   TOBACCO USER   Relevant Orders   Comprehensive metabolic panel (Completed)   HLD (hyperlipidemia)   Relevant Orders   Comprehensive metabolic panel (Completed)   Lipid panel (Completed)    Other Visit Diagnoses    Annual physical exam    -  Primary   Need for tetanus, diphtheria, and acellular pertussis (Tdap) vaccine in patient of adolescent age or older       Relevant Orders   Tdap vaccine greater than or equal to 7yo IM (Completed)   Need for vaccination against Streptococcus pneumoniae       Relevant Orders   Pneumococcal polysaccharide vaccine 23-valent greater than or equal to 2yo subcutaneous/IM (Completed)      Screening: -- Blood pressure screen normal -- cholesterol screening: will obtain -- Weight screening: overweight: continue to monitor -- Diabetes Screening: will obtain -- Nutrition: Encouraged healthy diet  The 10-year ASCVD risk score Mikey Bussing DC Jr., et al., 2013) is: 10.3%   Values used to calculate the score:     Age:  43 years     Sex: Female     Is Non-Hispanic African American: No     Diabetic: No     Tobacco smoker: Yes     Systolic Blood Pressure: 916 mmHg     Is BP treated: No     HDL Cholesterol: 57.8 mg/dL     Total Cholesterol: 232 mg/dL  -- Statin therapy for Age 20-75 with CVD risk  >7.5%  Psych -- Depression screening (PHQ-9):   Depression screen [facility] 2/9 01/01/2020 12/24/2018 12/12/2017  Decreased Interest 0 0 0  Down, Depressed, Hopeless 0 0 0  PHQ - 2 Score 0 0 0      Safety -- tobacco screening: not using -- alcohol screening:  low-risk usage. -- no evidence of domestic violence or intimate partner violence.   Cancer Screening -- pap smear not collected per ASCCP guidelines -- family history of breast cancer screening: done. not at high risk. -- Mammogram - up to date -- Colon cancer (age 45+)-- up to date  Immunizations Immunization History  Administered Date(s) Administered  . Influenza Whole 01/05/2010  . Influenza,inj,Quad PF,6+ Mos 12/12/2013, 01/01/2015, 12/24/2018, 01/01/2020  . PFIZER SARS-COV-2 Vaccination 06/22/2019, 07/14/2019  . Pneumococcal Polysaccharide-23 01/01/2020  . Td 01/05/2010  . Tdap 01/01/2020    -- flu vaccine up to date -- TDAP q10 years up to date -- Shingles (age >23) she will check insurance -- PPSV-23 (19-64 with chronic disease or smoking) given today -- Covid-19 Vaccine up to date   Encouraged healthy diet and exercise. Encouraged regular vision and dental care.    Lesleigh Noe, MD

## 2020-01-01 NOTE — Patient Instructions (Addendum)
Bone health recommendations  1) 800 units of Vitamin D daily 2) Get 1200 mg of elemental calcium --- this is best from your diet. Try to track how much calcium you get on a typical day. You could find ways to add more (dairy products, leafy greens). Take a supplement for whatever you don't typically get so you reach 1200 mg of calcium.  3) Physical activity (ideally weight bearing) - like walking briskly 30 minutes 5 days a week.    Check with insurance about Shingrix coverage and call back to schedule or get it at a pharmacy  Continue to work on quitting smoking!

## 2020-01-05 ENCOUNTER — Telehealth: Payer: Self-pay | Admitting: Family Medicine

## 2020-01-05 DIAGNOSIS — E785 Hyperlipidemia, unspecified: Secondary | ICD-10-CM

## 2020-01-05 MED ORDER — ROSUVASTATIN CALCIUM 5 MG PO TABS: 5.0000 mg | ORAL_TABLET | Freq: Every day | ORAL | 3 refills | Status: DC

## 2020-01-05 NOTE — Telephone Encounter (Signed)
Rosuvastatin is a great option!  I sent this to her pharmacy.

## 2020-01-05 NOTE — Telephone Encounter (Signed)
Patient called.  Patient said she received a message from Dr.Cody to start Atorvastatin.  Patient said her family is taking Rosuvastatin and they have done really well.Patient would like to try Rosuvastatin.  Patient said she'd like to start medication at a low dose.  Patient uses [facility]. Patient said if Dr.Cody wants patient to take the Atorvastatin, then she'll try it.

## 2020-01-06 NOTE — Telephone Encounter (Signed)
Spoke to pt and let her know of Rx sent to pharmacy.

## 2020-01-21 DIAGNOSIS — L820 Inflamed seborrheic keratosis: Secondary | ICD-10-CM | POA: Diagnosis not present

## 2020-01-21 DIAGNOSIS — D225 Melanocytic nevi of trunk: Secondary | ICD-10-CM | POA: Diagnosis not present

## 2020-01-21 DIAGNOSIS — Z85828 Personal history of other malignant neoplasm of skin: Secondary | ICD-10-CM | POA: Diagnosis not present

## 2020-01-21 DIAGNOSIS — L570 Actinic keratosis: Secondary | ICD-10-CM | POA: Diagnosis not present

## 2020-06-03 DIAGNOSIS — Z124 Encounter for screening for malignant neoplasm of cervix: Secondary | ICD-10-CM | POA: Diagnosis not present

## 2020-06-03 DIAGNOSIS — Z01419 Encounter for gynecological examination (general) (routine) without abnormal findings: Secondary | ICD-10-CM | POA: Diagnosis not present

## 2020-06-03 DIAGNOSIS — Z6825 Body mass index (BMI) 25.0-25.9, adult: Secondary | ICD-10-CM | POA: Diagnosis not present

## 2020-06-04 DIAGNOSIS — Z124 Encounter for screening for malignant neoplasm of cervix: Secondary | ICD-10-CM | POA: Diagnosis not present

## 2020-06-04 LAB — HM PAP SMEAR: HM Pap smear: NEGATIVE

## 2020-07-21 DIAGNOSIS — L720 Epidermal cyst: Secondary | ICD-10-CM | POA: Diagnosis not present

## 2020-07-21 DIAGNOSIS — L821 Other seborrheic keratosis: Secondary | ICD-10-CM | POA: Diagnosis not present

## 2020-07-21 DIAGNOSIS — D225 Melanocytic nevi of trunk: Secondary | ICD-10-CM | POA: Diagnosis not present

## 2020-07-21 DIAGNOSIS — D485 Neoplasm of uncertain behavior of skin: Secondary | ICD-10-CM | POA: Diagnosis not present

## 2020-07-21 DIAGNOSIS — Z85828 Personal history of other malignant neoplasm of skin: Secondary | ICD-10-CM | POA: Diagnosis not present

## 2020-07-21 DIAGNOSIS — L820 Inflamed seborrheic keratosis: Secondary | ICD-10-CM | POA: Diagnosis not present

## 2020-07-21 DIAGNOSIS — C44729 Squamous cell carcinoma of skin of left lower limb, including hip: Secondary | ICD-10-CM | POA: Diagnosis not present

## 2020-07-21 DIAGNOSIS — L570 Actinic keratosis: Secondary | ICD-10-CM | POA: Diagnosis not present

## 2020-07-21 DIAGNOSIS — L565 Disseminated superficial actinic porokeratosis (DSAP): Secondary | ICD-10-CM | POA: Diagnosis not present

## 2020-07-21 DIAGNOSIS — L814 Other melanin hyperpigmentation: Secondary | ICD-10-CM | POA: Diagnosis not present

## 2020-08-23 DIAGNOSIS — C44729 Squamous cell carcinoma of skin of left lower limb, including hip: Secondary | ICD-10-CM | POA: Diagnosis not present

## 2020-10-21 DIAGNOSIS — H00025 Hordeolum internum left lower eyelid: Secondary | ICD-10-CM | POA: Diagnosis not present

## 2020-10-26 DIAGNOSIS — Z1231 Encounter for screening mammogram for malignant neoplasm of breast: Secondary | ICD-10-CM | POA: Diagnosis not present

## 2020-10-26 LAB — HM MAMMOGRAPHY

## 2020-11-15 ENCOUNTER — Encounter: Payer: Self-pay | Admitting: Family Medicine

## 2020-12-23 ENCOUNTER — Other Ambulatory Visit: Payer: Self-pay | Admitting: Family Medicine

## 2020-12-23 DIAGNOSIS — E785 Hyperlipidemia, unspecified: Secondary | ICD-10-CM

## 2020-12-31 ENCOUNTER — Ambulatory Visit: Payer: BC Managed Care – PPO

## 2021-01-04 ENCOUNTER — Encounter: Payer: Self-pay | Admitting: Family Medicine

## 2021-01-04 ENCOUNTER — Ambulatory Visit (INDEPENDENT_AMBULATORY_CARE_PROVIDER_SITE_OTHER): Payer: Medicare HMO | Admitting: Family Medicine

## 2021-01-04 ENCOUNTER — Other Ambulatory Visit: Payer: Self-pay

## 2021-01-04 VITALS — BP 120/70 | HR 68 | Temp 97.0°F | Ht 61.0 in | Wt 136.2 lb

## 2021-01-04 DIAGNOSIS — Z0000 Encounter for general adult medical examination without abnormal findings: Secondary | ICD-10-CM

## 2021-01-04 DIAGNOSIS — E785 Hyperlipidemia, unspecified: Secondary | ICD-10-CM | POA: Diagnosis not present

## 2021-01-04 DIAGNOSIS — Z23 Encounter for immunization: Secondary | ICD-10-CM

## 2021-01-04 LAB — COMPREHENSIVE METABOLIC PANEL
ALT: 10 U/L (ref 0–35)
AST: 18 U/L (ref 0–37)
Albumin: 4.5 g/dL (ref 3.5–5.2)
Alkaline Phosphatase: 58 U/L (ref 39–117)
BUN: 17 mg/dL (ref 6–23)
CO2: 30 mEq/L (ref 19–32)
Calcium: 9.4 mg/dL (ref 8.4–10.5)
Chloride: 101 mEq/L (ref 96–112)
Creatinine, Ser: 0.83 mg/dL (ref 0.40–1.20)
GFR: 73.84 mL/min (ref >60.00)
Glucose, Bld: 92 mg/dL (ref 70–99)
Potassium: 3.7 mEq/L (ref 3.5–5.1)
Sodium: 139 mEq/L (ref 135–145)
Total Bilirubin: 0.5 mg/dL (ref 0.2–1.2)
Total Protein: 7.1 g/dL (ref 6.0–8.3)

## 2021-01-04 LAB — LIPID PANEL
Cholesterol: 170 mg/dL (ref 0–200)
HDL: 60.4 mg/dL (ref >39.00)
LDL Cholesterol: 91 mg/dL (ref 0–99)
NonHDL: 109.71
Total CHOL/HDL Ratio: 3
Triglycerides: 95 mg/dL (ref 0.0–149.0)
VLDL: 19 mg/dL (ref 0.0–40.0)

## 2021-01-04 MED ORDER — ROSUVASTATIN CALCIUM 5 MG PO TABS: 5.0000 mg | ORAL_TABLET | Freq: Every day | ORAL | 3 refills | Status: DC

## 2021-01-04 NOTE — Patient Instructions (Addendum)
Check with pharmacy on the Shingles Vaccine

## 2021-01-04 NOTE — Progress Notes (Signed)
Subjective:    Deanna Bell is a 65 y.o. female who presents for a Welcome to Medicare exam.   Review of Systems Review of Systems  Constitutional:  Negative for chills and fever.  HENT:  Negative for congestion and sore throat.   Eyes:  Negative for blurred vision and double vision.  Respiratory:  Negative for shortness of breath.   Cardiovascular:  Negative for chest pain.  Gastrointestinal:  Negative for heartburn, nausea and vomiting.  Genitourinary: Negative.   Musculoskeletal:  Positive for joint pain. Negative for myalgias.  Skin:  Negative for rash.  Neurological:  Negative for dizziness and headaches.  Endo/Heme/Allergies:  Does not bruise/bleed easily.  Psychiatric/Behavioral:  Negative for depression. The patient is not nervous/anxious.     Cardiac Risk Factors include: advanced age (>46men, >8 women);dyslipidemia      Objective:    Today's Vitals   01/04/21 0829  BP: 120/70  Pulse: 68  Temp: (!) 97 F (36.1 C)  TempSrc: Temporal  SpO2: 99%  Weight: 136 lb 4 oz (61.8 kg)  Height: 5\' 1"  (1.549 m)  Body mass index is 25.74 kg/m.  Medications Outpatient Encounter Medications as of 01/04/2021  Medication Sig  . cholecalciferol (VITAMIN D) 25 MCG (1000 UNIT) tablet Take 1 tablet by mouth daily at 12 noon.  Marland Kitchen ibuprofen (ADVIL,MOTRIN) 200 MG tablet Take 200 mg by mouth every 6 (six) hours as needed. Reported on 04/13/2015  . [DISCONTINUED] rosuvastatin (CRESTOR) 5 MG tablet TAKE 1 TABLET BY MOUTH EVERY DAY  . rosuvastatin (CRESTOR) 5 MG tablet Take 1 tablet (5 mg total) by mouth daily.   No facility-administered encounter medications on file as of 01/04/2021.     History: Past Medical History:  Diagnosis Date  . No pertinent past medical history    Past Surgical History:  Procedure Laterality Date  . NO PAST SURGERIES      Family History  Problem Relation Age of Onset  . Breast cancer Mother 45  . Throat cancer Mother    Social History    Occupational History    Employer: unemployed  Tobacco Use  . Smoking status: Every Day    Packs/day: 0.50    Years: 38.00    Pack years: 19.00    Types: Cigarettes  . Smokeless tobacco: Never  . Tobacco comments:    Down to 4-5 cigs a day, trying to quit - for the last 5 years  Substance and Sexual Activity  . Alcohol use: No  . Drug use: No  . Sexual activity: Not Currently    Birth control/protection: Post-menopausal    Tobacco Counseling Ready to quit: Not Answered Counseling given: Not Answered Tobacco comments: Down to 4-5 cigs a day, trying to quit - for the last 5 years   Immunizations and Health Maintenance Immunization History  Administered Date(s) Administered  . Fluad Quad(high Dose 65+) 01/04/2021  . Influenza Whole 01/05/2010  . Influenza,inj,Quad PF,6+ Mos 12/12/2013, 01/01/2015, 12/24/2018, 01/01/2020  . PFIZER(Purple Top)SARS-COV-2 Vaccination 06/22/2019, 07/14/2019  . Pneumococcal Polysaccharide-23 01/01/2020  . Td 01/05/2010  . Tdap 01/01/2020   Health Maintenance Due  Topic Date Due  . Zoster Vaccines- Shingrix (1 of 2) Never done  . COVID-19 Vaccine (3 - Booster for Pfizer series) 12/14/2019  . DEXA SCAN  Never done    Activities of Daily Living In your present state of health, do you have any difficulty performing the following activities: 01/04/2021  Hearing? N  Vision? N  Difficulty concentrating or making  decisions? N  Walking or climbing stairs? N  Dressing or bathing? N  Doing errands, shopping? N  Preparing Food and eating ? N  Using the Toilet? N  In the past six months, have you accidently leaked urine? N  Do you have problems with loss of bowel control? N  Managing your Medications? N  Managing your Finances? N  Housekeeping or managing your Housekeeping? N  Some recent data might be hidden    Physical Exam   Heart: RRR no murmr Lungs: CTAB  EKG: NSR, widened p-wave in II (possible LAE),   Advanced Directives: Does  Patient Have a Medical Advance Directive?: No Does patient want to make changes to medical advance directive?: Yes (MAU/Ambulatory/Procedural Areas - Information given)    Assessment:    This is a routine wellness examination for this patient .   Vision/Hearing screen Hearing Screening   250Hz  500Hz  1000Hz  2000Hz  4000Hz   Right ear 20 20 20 20 20   Left ear 20 20 20 20 20    Vision Screening   Right eye Left eye Both eyes  Without correction     With correction 20/20 20/20 20/20     Dietary issues and exercise activities discussed:  Current Exercise Habits: Home exercise routine, Type of exercise: walking, Time (Minutes): 30, Frequency (Times/Week): 7, Weekly Exercise (Minutes/Week): 210, Intensity: Mild, Exercise limited by: orthopedic condition(s)   Goals     . Patient Stated     Maintain current activity      Depression Screen PHQ 2/9 Scores 01/04/2021 01/01/2020 12/24/2018 12/12/2017  PHQ - 2 Score 0 0 0 0     Fall Risk Fall Risk  01/04/2021  Falls in the past year? 0  Number falls in past yr: 0    Cognitive Function:      Mini-Cog - 01/04/21 0847     Normal clock drawing test? yes    How many words correct? 3            Health Maintenance Due  Topic Date Due  . Zoster Vaccines- Shingrix (1 of 2) Never done  . COVID-19 Vaccine (3 - Booster for Pfizer series) 12/14/2019  . DEXA SCAN  Never done       Patient Care Team: Lesleigh Noe, MD as PCP - General (Family Medicine) Paula Compton, MD as Consulting Physician (Obstetrics and Gynecology) Sydnee Levans, MD as Referring Physician (Dermatology)     Plan:    Problem List Items Addressed This Visit       Other   HLD (hyperlipidemia)   Relevant Medications   rosuvastatin (CRESTOR) 5 MG tablet   Other Relevant Orders   Comprehensive metabolic panel   Lipid panel   Other Visit Diagnoses     Welcome to Medicare preventive visit    -  Primary   Relevant Orders   EKG 12-Lead   Need for  influenza vaccination       Relevant Orders   Flu Vaccine QUAD High Dose(Fluad) (Completed)   Encounter for Medicare annual wellness exam            I have personally reviewed and noted the following in the patient's chart:   Medical and social history Use of alcohol, tobacco or illicit drugs  Current medications and supplements Functional ability and status Nutritional status Physical activity Advanced directives List of other physicians Hospitalizations, surgeries, and ER visits in previous 12 months Vitals Screenings to include cognitive, depression, and falls Referrals and appointments  In addition, I have  reviewed and discussed with patient certain preventive protocols, quality metrics, and best practice recommendations. A written personalized care plan for preventive services as well as general preventive health recommendations were provided to patient.     Lesleigh Noe, MD 01/04/2021

## 2021-01-05 ENCOUNTER — Encounter: Payer: Self-pay | Admitting: Family Medicine

## 2021-01-05 DIAGNOSIS — R9431 Abnormal electrocardiogram [ECG] [EKG]: Secondary | ICD-10-CM

## 2021-01-20 DIAGNOSIS — Z85828 Personal history of other malignant neoplasm of skin: Secondary | ICD-10-CM | POA: Diagnosis not present

## 2021-01-20 DIAGNOSIS — L820 Inflamed seborrheic keratosis: Secondary | ICD-10-CM | POA: Diagnosis not present

## 2021-01-20 DIAGNOSIS — L814 Other melanin hyperpigmentation: Secondary | ICD-10-CM | POA: Diagnosis not present

## 2021-01-20 DIAGNOSIS — L720 Epidermal cyst: Secondary | ICD-10-CM | POA: Diagnosis not present

## 2021-01-20 DIAGNOSIS — L821 Other seborrheic keratosis: Secondary | ICD-10-CM | POA: Diagnosis not present

## 2021-01-20 DIAGNOSIS — L570 Actinic keratosis: Secondary | ICD-10-CM | POA: Diagnosis not present

## 2021-01-20 DIAGNOSIS — D225 Melanocytic nevi of trunk: Secondary | ICD-10-CM | POA: Diagnosis not present

## 2021-02-23 NOTE — Progress Notes (Signed)
Cardiology Office Note:    Date:  02/24/2021   ID:  Deanna Bell, DOB Jul 12, 1955, MRN [id number]  PCP:  Lesleigh Noe, MD   [place] Providers Cardiologist:  Werner Lean, MD     Referring MD: Lesleigh Noe, MD   CC: EKG changes Consulted for the evaluation of an abnormal ECG at the behest of Lesleigh Noe, MD   History of Present Illness:    Deanna Bell is a 65 y.o. female with a hx of HLD and tobacco use who presents for evaluation 02/24/21.  Patient notes that she is feeling feel fine.  She does not that she has new chest tightness that has persisted for the past year.    Discomfort occurs with activity, and improves with time.  Patient exertion notable for splitting wood and push mowing  with some replication and feels no symptoms.  No shortness of breath, DOE.  No PND or orthopnea.  No weight gain, leg swelling , or abdominal swelling.  No syncope or near syncope . Notes  no palpitations or funny heart beats.     Patient reports no prior cardiac testing.  No history of pre-eclampsia, gestation HTN or gestational DM.    Past Medical History:  Diagnosis Date   No pertinent past medical history     Past Surgical History:  Procedure Laterality Date   NO PAST SURGERIES      Current Medications: Current Meds  Medication Sig   cholecalciferol (VITAMIN D) 25 MCG (1000 UNIT) tablet Take 1 tablet by mouth daily at 12 noon.   fluorouracil (EFUDEX) 5 % cream as needed. Sun damage   ibuprofen (ADVIL,MOTRIN) 200 MG tablet Take 200 mg by mouth every 6 (six) hours as needed. Reported on 04/13/2015   metoprolol tartrate (LOPRESSOR) 100 MG tablet Take 2 hours prior to Cardiac CT   rosuvastatin (CRESTOR) 5 MG tablet Take 1 tablet (5 mg total) by mouth daily.     Allergies:   Patient has no known allergies.   Social History   Socioeconomic History   Marital status: Married    Spouse name: Elta Guadeloupe   Number of children: 1   Years of education: high school    Highest education level: Not on file  Occupational History    Employer: unemployed  Tobacco Use   Smoking status: Every Day    Packs/day: 0.50    Years: 38.00    Pack years: 19.00    Types: Cigarettes   Smokeless tobacco: Never   Tobacco comments:    Down to 4-5 cigs a day, trying to quit - for the last 5 years  Substance and Sexual Activity   Alcohol use: No   Drug use: No   Sexual activity: Not Currently    Birth control/protection: Post-menopausal  Other Topics Concern   Not on file  Social History Narrative   06/02/19   From: the area   Living: Husband, Elta Guadeloupe   Work: retired, was a Systems developer      Family: Son - Roderic Palau - 3 grandchildren who live nearby      Enjoys: yardwork, gardening      Exercise: workouts in the morning 10-30 minutes, has knee pain, walks 30 minutes for her dog   Diet: meat, veggies, a lot of sweets      Safety   Seat belts: Yes    Guns: Yes  and secure   Safe in relationships: Yes    Social Determinants of Health  Financial Resource Strain: Not on file  Food Insecurity: Not on file  Transportation Needs: Not on file  Physical Activity: Not on file  Stress: Not on file  Social Connections: Not on file     Family History: The patient's family history includes Breast cancer (age of onset: 27) in her mother; Throat cancer in her mother. No heart disease in family.  ROS:   Please see the history of present illness.     All other systems reviewed and are negative.  EKGs/Labs/Other Studies Reviewed:    The following studies were reviewed today:  EKG:  EKG is  ordered today.  The ekg ordered today demonstrates  02/24/21:  SR rate 73 anterior infarct pattern 01/04/21: SR rate 82 anterior infarct pattern  Recent Labs: 01/04/2021: ALT 10; BUN 17; Creatinine, Ser 0.83; Potassium 3.7; Sodium 139  Recent Lipid Panel    Component Value Date/Time   CHOL 170 01/04/2021 0917   TRIG 95.0 01/04/2021 0917   HDL 60.40 01/04/2021 0917    CHOLHDL 3 01/04/2021 0917   VLDL 19.0 01/04/2021 0917   LDLCALC 91 01/04/2021 0917        Physical Exam:    VS:  BP 120/68   Pulse 73   Ht 5\' 1"  (1.549 m)   Wt 138 lb 6.4 oz (62.8 kg)   SpO2 98%   BMI 26.15 kg/m     Wt Readings from Last 3 Encounters:  02/24/21 138 lb 6.4 oz (62.8 kg)  01/04/21 136 lb 4 oz (61.8 kg)  01/01/20 136 lb 4 oz (61.8 kg)    Gen: no distress, well nourished  Neck: No JVD Cardiac: No Rubs or Gallops, no Murmurs, normal rate, +2 radial pulses Respiratory: Clear to auscultation bilaterally, normal effort, normal  respiratory rate GI: Soft, nontender, non-distended  MS: Non pitting R leg edema (mild);  moves all extremities Integument: Skin feels warm Neuro:  At time of evaluation, alert and oriented to person/place/time/situation Psych: Normal affect, patient feels normal   ASSESSMENT:    1. Precordial pain   2. TOBACCO USER   3. Hyperlipidemia, unspecified hyperlipidemia type    PLAN:    Precordial chest pain ECG pattern of anterior infarct Tobacco Abuse HLD Precordial Pain - The patient presents with  possible cardiac CP - based on results add ASA 81mg   current statin for now but will likely need escalation - Would recommend CCTA with possible FFR as needed to exclude obstructive CAD and to assess for non-obstructive CAD requiring secondary prevention - resting heart rate is 65-80 bpm, given BP room with add Metoprolol  100 mg PO 90 min prior to scan - we have discussed smoking cessation and she is taking 3 cigarettes today  Three months with me        Medication Adjustments/Labs and Tests Ordered: Current medicines are reviewed at length with the patient today.  Concerns regarding medicines are outlined above.  Orders Placed This Encounter  Procedures   CT CORONARY MORPH W/CTA COR W/SCORE W/CA W/CM &/OR WO/CM   EKG 12-Lead    Meds ordered this encounter  Medications   metoprolol tartrate (LOPRESSOR) 100 MG tablet    Sig:  Take 2 hours prior to Cardiac CT    Dispense:  1 tablet    Refill:  0     Patient Instructions  Medication Instructions:  Your physician recommends that you continue on your current medications as directed. Please refer to the Current Medication list given to you today.  *  If you need a refill on your cardiac medications before your next appointment, please call your pharmacy*   Lab Work: TODAY: BMP  If you have labs (blood work) drawn today and your tests are completely normal, you will receive your results only by: [place] (if you have MyChart) OR A paper copy in the mail If you have any lab test that is abnormal or we need to change your treatment, we will call you to review the results.   Testing/Procedures: Your physician has requested that you have cardiac CT. Cardiac computed tomography (CT) is a painless test that uses an x-ray machine to take clear, detailed pictures of your heart.  Please follow instruction sheet as given.    Follow-Up: At [facility], you and your health needs are our priority.  As part of our continuing mission to provide you with exceptional heart care, we have created designated Provider Care Teams.  These Care Teams include your primary Cardiologist (physician) and Advanced Practice Providers (APPs -  Physician Assistants and Nurse Practitioners) who all work together to provide you with the care you need, when you need it.    Your next appointment:   3 month(s)  The format for your next appointment:   In Person  Provider:   Werner Lean, MD     Other Instructions    [facility] [address] [place], [place] [address] [phone]   At [facility], please arrive at the [facility] main entrance (entrance A) of [facility] 30 minutes prior to test start time. You can use the FREE valet parking offered at the main entrance (encouraged to control the heart rate for the test) Proceed to  the [facility] Radiology Department (first floor) to check-in and test prep.  Please follow these instructions carefully (unless otherwise directed):   On the Night Before the Test: Be sure to Drink plenty of water. Do not consume any caffeinated/decaffeinated beverages or chocolate 12 hours prior to your test. Do not take any antihistamines 12 hours prior to your test.   On the Day of the Test: Drink plenty of water until 1 hour prior to the test. Do not eat any food 4 hours prior to the test. You may take your regular medications prior to the test.  Take metoprolol (Lopressor) 100 mg by mouth two hours prior to test. FEMALES- please wear underwire-free bra if available, avoid dresses & tight clothing        After the Test: Drink plenty of water. After receiving IV contrast, you may experience a mild flushed feeling. This is normal. On occasion, you may experience a mild rash up to 24 hours after the test. This is not dangerous. If this occurs, you can take Benadryl 25 mg and increase your fluid intake. If you experience trouble breathing, this can be serious. If it is severe call 911 IMMEDIATELY. If it is mild, please call our office. If you take any of these medications: Glipizide/Metformin, Avandament, Glucavance, please do not take 48 hours after completing test unless otherwise instructed.  Please allow 2-4 weeks for scheduling of routine cardiac CTs. Some insurance companies require a pre-authorization which may delay scheduling of this test.   For non-scheduling related questions, please contact the cardiac imaging nurse navigator should you have any questions/concerns: Marchia Bond, Cardiac Imaging Nurse Navigator Gordy Clement, Cardiac Imaging Nurse Navigator [facility] Direct Office Dial: [phone]   For scheduling needs, including cancellations  and rescheduling, please call [place], [phone].     Signed, Werner Lean, MD   02/24/2021 9:44 AM    [place]

## 2021-02-24 ENCOUNTER — Ambulatory Visit: Payer: Medicare HMO | Admitting: Internal Medicine

## 2021-02-24 ENCOUNTER — Other Ambulatory Visit: Payer: Self-pay

## 2021-02-24 ENCOUNTER — Encounter: Payer: Self-pay | Admitting: Internal Medicine

## 2021-02-24 VITALS — BP 120/68 | HR 73 | Ht 61.0 in | Wt 138.4 lb

## 2021-02-24 DIAGNOSIS — R072 Precordial pain: Secondary | ICD-10-CM | POA: Insufficient documentation

## 2021-02-24 DIAGNOSIS — F17200 Nicotine dependence, unspecified, uncomplicated: Secondary | ICD-10-CM | POA: Diagnosis not present

## 2021-02-24 DIAGNOSIS — E785 Hyperlipidemia, unspecified: Secondary | ICD-10-CM | POA: Diagnosis not present

## 2021-02-24 LAB — BASIC METABOLIC PANEL
BUN/Creatinine Ratio: 18 (ref 12–28)
BUN: 15 mg/dL (ref 8–27)
CO2: 27 mmol/L (ref 20–29)
Calcium: 9.4 mg/dL (ref 8.7–10.3)
Chloride: 99 mmol/L (ref 96–106)
Creatinine, Ser: 0.82 mg/dL (ref 0.57–1.00)
Glucose: 91 mg/dL (ref 70–99)
Potassium: 4.9 mmol/L (ref 3.5–5.2)
Sodium: 139 mmol/L (ref 134–144)
eGFR: 79 mL/min/{1.73_m2} (ref >59)

## 2021-02-24 MED ORDER — METOPROLOL TARTRATE 100 MG PO TABS: ORAL_TABLET | ORAL | 0 refills | Status: DC

## 2021-02-24 NOTE — Patient Instructions (Signed)
Medication Instructions:  Your physician recommends that you continue on your current medications as directed. Please refer to the Current Medication list given to you today.  *If you need a refill on your cardiac medications before your next appointment, please call your pharmacy*   Lab Work: TODAY: BMP  If you have labs (blood work) drawn today and your tests are completely normal, you will receive your results only by: [place] (if you have MyChart) OR A paper copy in the mail If you have any lab test that is abnormal or we need to change your treatment, we will call you to review the results.   Testing/Procedures: Your physician has requested that you have cardiac CT. Cardiac computed tomography (CT) is a painless test that uses an x-ray machine to take clear, detailed pictures of your heart.  Please follow instruction sheet as given.    Follow-Up: At [facility], you and your health needs are our priority.  As part of our continuing mission to provide you with exceptional heart care, we have created designated Provider Care Teams.  These Care Teams include your primary Cardiologist (physician) and Advanced Practice Providers (APPs -  Physician Assistants and Nurse Practitioners) who all work together to provide you with the care you need, when you need it.    Your next appointment:   3 month(s)  The format for your next appointment:   In Person  Provider:   Werner Lean, MD     Other Instructions    [facility] [address] [place], [place] [address] [phone]   At [facility], please arrive at the [facility] main entrance (entrance A) of [facility] 30 minutes prior to test start time. You can use the FREE valet parking offered at the main entrance (encouraged to control the heart rate for the test) Proceed to the [facility] Radiology Department (first floor) to check-in and test prep.  Please follow these  instructions carefully (unless otherwise directed):   On the Night Before the Test: Be sure to Drink plenty of water. Do not consume any caffeinated/decaffeinated beverages or chocolate 12 hours prior to your test. Do not take any antihistamines 12 hours prior to your test.   On the Day of the Test: Drink plenty of water until 1 hour prior to the test. Do not eat any food 4 hours prior to the test. You may take your regular medications prior to the test.  Take metoprolol (Lopressor) 100 mg by mouth two hours prior to test. FEMALES- please wear underwire-free bra if available, avoid dresses & tight clothing        After the Test: Drink plenty of water. After receiving IV contrast, you may experience a mild flushed feeling. This is normal. On occasion, you may experience a mild rash up to 24 hours after the test. This is not dangerous. If this occurs, you can take Benadryl 25 mg and increase your fluid intake. If you experience trouble breathing, this can be serious. If it is severe call 911 IMMEDIATELY. If it is mild, please call our office. If you take any of these medications: Glipizide/Metformin, Avandament, Glucavance, please do not take 48 hours after completing test unless otherwise instructed.  Please allow 2-4 weeks for scheduling of routine cardiac CTs. Some insurance companies require a pre-authorization which may delay scheduling of this test.   For non-scheduling related questions, please contact the cardiac imaging nurse navigator should you have any questions/concerns: Marchia Bond,  Cardiac Imaging Nurse Navigator Gordy Clement, Cardiac Imaging Nurse Navigator [place] [facility] Direct Office Dial: [phone]   For scheduling needs, including cancellations and rescheduling, please call [place], [phone].

## 2021-02-24 NOTE — Addendum Note (Signed)
Addended by: Precious Gilding on: 02/24/2021 10:11 AM   Modules accepted: Orders

## 2021-03-15 ENCOUNTER — Telehealth (HOSPITAL_COMMUNITY): Payer: Self-pay | Admitting: Emergency Medicine

## 2021-03-15 NOTE — Telephone Encounter (Signed)
Reaching out to patient to offer assistance regarding upcoming cardiac imaging study; pt verbalizes understanding of appt date/time, parking situation and where to check in, pre-test NPO status and medications ordered, and verified current allergies; name and call back number provided for further questions should they arise Marchia Bond RN Navigator Cardiac Imaging [facility] [phone] office [phone] cell  Arrival 9am 100mg  metoprolol tartrate  Denies iv issues

## 2021-03-17 ENCOUNTER — Other Ambulatory Visit: Payer: Self-pay

## 2021-03-17 ENCOUNTER — Ambulatory Visit (HOSPITAL_COMMUNITY)
Admission: RE | Admit: 2021-03-17 | Discharge: 2021-03-17 | Disposition: A | Discharge status: Home / Self Care | Payer: Medicare HMO | Source: Ambulatory Visit | Attending: Internal Medicine | Admitting: Internal Medicine

## 2021-03-17 ENCOUNTER — Encounter (HOSPITAL_COMMUNITY): Payer: Self-pay

## 2021-03-17 DIAGNOSIS — R072 Precordial pain: Secondary | ICD-10-CM | POA: Insufficient documentation

## 2021-03-17 DIAGNOSIS — I700 Atherosclerosis of aorta: Secondary | ICD-10-CM | POA: Diagnosis not present

## 2021-03-17 MED ORDER — NITROGLYCERIN 0.4 MG SL SUBL
0.8000 mg | SUBLINGUAL_TABLET | Freq: Once | SUBLINGUAL | Status: AC
  Administered 2021-03-17: 0.8 mg via SUBLINGUAL

## 2021-03-17 MED ORDER — IOHEXOL 350 MG/ML SOLN
95.0000 mL | Freq: Once | INTRAVENOUS | Status: AC | PRN
  Administered 2021-03-17: 95 mL via INTRAVENOUS

## 2021-03-17 MED ORDER — NITROGLYCERIN 0.4 MG SL SUBL
SUBLINGUAL_TABLET | SUBLINGUAL | Status: AC
  Filled 2021-03-17: qty 2

## 2021-03-22 ENCOUNTER — Telehealth: Payer: Self-pay

## 2021-03-22 MED ORDER — ROSUVASTATIN CALCIUM 10 MG PO TABS: 10.0000 mg | ORAL_TABLET | Freq: Every day | ORAL | 3 refills | Status: DC

## 2021-03-22 NOTE — Telephone Encounter (Signed)
Patient notified of result.  Please refer to phone note from today for complete details.   Deanna Bell Sharaya Boruff, RN 03/22/2021 9:45 AM  Pt has a follow up visit with MD on 05/24/21, I advised pt to come to OV fasting for blood draw.  Pt verbalizes understanding all questions answered.

## 2021-03-22 NOTE — Telephone Encounter (Signed)
-----   Message from Werner Lean, MD sent at 03/20/2021  2:42 PM EST ----- Results: Minimal non obstructive CAD Plan: Smoking cessation and increase to rosuvastatin 10, labs in 3 months  Werner Lean, MD

## 2021-05-03 DIAGNOSIS — L565 Disseminated superficial actinic porokeratosis (DSAP): Secondary | ICD-10-CM | POA: Diagnosis not present

## 2021-05-03 DIAGNOSIS — Z85828 Personal history of other malignant neoplasm of skin: Secondary | ICD-10-CM | POA: Diagnosis not present

## 2021-05-03 DIAGNOSIS — L570 Actinic keratosis: Secondary | ICD-10-CM | POA: Diagnosis not present

## 2021-05-19 ENCOUNTER — Encounter: Payer: Self-pay | Admitting: Family Medicine

## 2021-05-23 NOTE — Progress Notes (Signed)
Cardiology Office Note:    Date:  05/24/2021   ID:  [name], DOB Jun 01, 1955, MRN [id number]  PCP:  Deanna Noe, MD   [facility] Providers Cardiologist:  Deanna Lean, MD     Referring MD: Deanna Noe, MD   CC: CP follow Up  History of Present Illness:    Deanna Bell is a 66 y.o. female with a hx of HLD and tobacco abuse.  Seen in 2022 with minimal no obstructive CAD after CCTA.  Patient notes that she is doing well.   Dealing with pulled muscle in her back and has had minimal activity There are no interval hospital/ED visit.    No chest pain or pressure .  No SOB/DOE and no PND/Orthopnea.  No weight gain or leg swelling.  No palpitations or syncope .  Still smoking a little bit.  Does note easy bruising with normal hemoglobin in the past   Past Medical History:  Diagnosis Date   No pertinent past medical history     Past Surgical History:  Procedure Laterality Date   NO PAST SURGERIES      Current Medications: Current Meds  Medication Sig   aspirin EC 81 MG tablet Take 1 tablet (81 mg total) by mouth daily. Swallow whole.   cholecalciferol (VITAMIN D) 25 MCG (1000 UNIT) tablet Take 1 tablet by mouth daily at 12 noon.   fluorouracil (EFUDEX) 5 % cream as needed. Sun damage   ibuprofen (ADVIL,MOTRIN) 200 MG tablet Take 200 mg by mouth every 6 (six) hours as needed. Reported on 04/13/2015   metoprolol tartrate (LOPRESSOR) 100 MG tablet Take 2 hours prior to Cardiac CT   mupirocin ointment (BACTROBAN) 2 % Apply 1 application topically as needed.   rosuvastatin (CRESTOR) 10 MG tablet Take 1 tablet (10 mg total) by mouth daily.   tretinoin (RETIN-A) 0.05 % cream Apply topically as needed.     Allergies:   Patient has no known allergies.   Social History   Socioeconomic History   Marital status: Married    Spouse name: Deanna Bell   Number of children: 1   Years of education: high school   Highest education level: Not on file  Occupational  History    Employer: unemployed  Tobacco Use   Smoking status: Every Day    Packs/day: 0.50    Years: 38.00    Pack years: 19.00    Types: Cigarettes   Smokeless tobacco: Never   Tobacco comments:    Down to 4-5 cigs a day, trying to quit - for the last 5 years  Substance and Sexual Activity   Alcohol use: No   Drug use: No   Sexual activity: Not Currently    Birth control/protection: Post-menopausal  Other Topics Concern   Not on file  Social History Narrative   06/02/19   From: the area   Living: Husband, Deanna Bell   Work: retired, was a Systems developer      Family: Son - Deanna Bell - 3 grandchildren who live nearby      Enjoys: yardwork, gardening      Exercise: workouts in the morning 10-30 minutes, has knee pain, walks 30 minutes for her dog   Diet: meat, veggies, a lot of sweets      Safety   Seat belts: Yes    Guns: Yes  and secure   Safe in relationships: Yes    Social Determinants of Health   Financial Resource Strain: Not on  file  Food Insecurity: Not on file  Transportation Needs: Not on file  Physical Activity: Not on file  Stress: Not on file  Social Connections: Not on file     Family History: The patient's family history includes Breast cancer (age of onset: 23) in her mother; Throat cancer in her mother. No heart disease in family.  ROS:   Please see the history of present illness.     All other systems reviewed and are negative.  EKGs/Labs/Other Studies Reviewed:    The following studies were reviewed today:  EKG:   02/24/21:  SR rate 73 anterior infarct pattern 01/04/21: SR rate 82 anterior infarct pattern  Cardiac CT: Date: 212/8/22 Results: IMPRESSION: 1.  Minimal nonobstructive CAD, CADRADS = 1.   2. Coronary calcium score of 30. This was 68th percentile for age and sex matched control.  3. Aortic atherosclerosis  Recent Labs: 01/04/2021: ALT 10 02/24/2021: BUN 15; Creatinine, Ser 0.82; Potassium 4.9; Sodium 139  Recent Lipid  Panel    Component Value Date/Time   CHOL 170 01/04/2021 0917   TRIG 95.0 01/04/2021 0917   HDL 60.40 01/04/2021 0917   CHOLHDL 3 01/04/2021 0917   VLDL 19.0 01/04/2021 0917   LDLCALC 91 01/04/2021 0917        Physical Exam:    VS:  BP 110/66    Pulse 78    Ht 5\' 1"  (1.549 m)    Wt 63 kg    SpO2 98%    BMI 26.26 kg/m     Wt Readings from Last 3 Encounters:  05/24/21 63 kg  02/24/21 62.8 kg  01/04/21 61.8 kg    Gen: no distress, well nourished  Neck: No JVD Cardiac: No Rubs or Gallops, no Murmurs, normal rate, +2 radial pulses Respiratory: Clear to auscultation bilaterally, normal effort, normal  respiratory rate GI: Soft, nontender, non-distended  MS: Non pitting bilateral leg edema (mild);  moves all extremities Integument: Skin feels warm Neuro:  At time of evaluation, alert and oriented to person/place/time/situation Psych: Normal affect, patient feels normal   ASSESSMENT:    1. Aortic atherosclerosis ([place])   2. Coronary artery disease involving native coronary artery of native heart without angina pectoris   3. Tobacco abuse     PLAN:    Minimal non obstructive CAD Aortic atherosclerosis Tobacco Abuse HLD LE Swelling - ASA 81 mg PO daily started today - rosuvastatin 10 mg  - lipids and ALT today - smoking cessation discussed - continue compression stocking - we had discussed red flag symptoms for HF, discussed dietary changes       Medication Adjustments/Labs and Tests Ordered: Current medicines are reviewed at length with the patient today.  Concerns regarding medicines are outlined above.  Orders Placed This Encounter  Procedures   ALT   Lipid panel    Meds ordered this encounter  Medications   aspirin EC 81 MG tablet    Sig: Take 1 tablet (81 mg total) by mouth daily. Swallow whole.    Dispense:  90 tablet    Refill:  3     Patient Instructions  Medication Instructions:  Your physician has recommended you make the following change in  your medication:  START: Aspirin 81 mg by mouth daily  *If you need a refill on your cardiac medications before your next appointment, please call your pharmacy*   Lab Work: TODAY: Lipid Panel and ALT If you have labs (blood work) drawn today and your tests are completely normal,  you will receive your results only by: [place] (if you have MyChart) OR A paper copy in the mail If you have any lab test that is abnormal or we need to change your treatment, we will call you to review the results.   Testing/Procedures: NONE   Follow-Up: At [facility], you and your health needs are our priority.  As part of our continuing mission to provide you with exceptional heart care, we have created designated Provider Care Teams.  These Care Teams include your primary Cardiologist (physician) and Advanced Practice Providers (APPs -  Physician Assistants and Nurse Practitioners) who all work together to provide you with the care you need, when you need it.   Your next appointment:   1 year(s)  The format for your next appointment:   In Person  Provider:   Werner Lean, MD       Signed, Deanna Lean, MD  05/24/2021 9:00 AM    [place]

## 2021-05-24 ENCOUNTER — Other Ambulatory Visit: Payer: Self-pay

## 2021-05-24 ENCOUNTER — Ambulatory Visit: Payer: Medicare HMO | Admitting: Internal Medicine

## 2021-05-24 ENCOUNTER — Encounter: Payer: Self-pay | Admitting: Internal Medicine

## 2021-05-24 VITALS — BP 110/66 | HR 78 | Ht 61.0 in | Wt 139.0 lb

## 2021-05-24 DIAGNOSIS — I2510 Atherosclerotic heart disease of native coronary artery without angina pectoris: Secondary | ICD-10-CM | POA: Insufficient documentation

## 2021-05-24 DIAGNOSIS — I700 Atherosclerosis of aorta: Secondary | ICD-10-CM | POA: Diagnosis not present

## 2021-05-24 DIAGNOSIS — Z720 Tobacco use: Secondary | ICD-10-CM

## 2021-05-24 LAB — LIPID PANEL
Chol/HDL Ratio: 2.5 ratio (ref 0.0–4.4)
Cholesterol, Total: 157 mg/dL (ref 100–199)
HDL: 63 mg/dL (ref >39)
LDL Chol Calc (NIH): 81 mg/dL (ref 0–99)
Triglycerides: 67 mg/dL (ref 0–149)
VLDL Cholesterol Cal: 13 mg/dL (ref 5–40)

## 2021-05-24 LAB — ALT: ALT: 12 IU/L (ref 0–32)

## 2021-05-24 MED ORDER — ASPIRIN EC 81 MG PO TBEC: 81.0000 mg | DELAYED_RELEASE_TABLET | Freq: Every day | ORAL | 3 refills | Status: DC

## 2021-05-24 NOTE — Patient Instructions (Signed)
Medication Instructions:  Your physician has recommended you make the following change in your medication:  START: Aspirin 81 mg by mouth daily  *If you need a refill on your cardiac medications before your next appointment, please call your pharmacy*   Lab Work: TODAY: Lipid Panel and ALT If you have labs (blood work) drawn today and your tests are completely normal, you will receive your results only by: [place] (if you have MyChart) OR A paper copy in the mail If you have any lab test that is abnormal or we need to change your treatment, we will call you to review the results.   Testing/Procedures: NONE   Follow-Up: At [facility], you and your health needs are our priority.  As part of our continuing mission to provide you with exceptional heart care, we have created designated Provider Care Teams.  These Care Teams include your primary Cardiologist (physician) and Advanced Practice Providers (APPs -  Physician Assistants and Nurse Practitioners) who all work together to provide you with the care you need, when you need it.   Your next appointment:   1 year(s)  The format for your next appointment:   In Person  Provider:   Werner Lean, MD

## 2021-05-31 ENCOUNTER — Telehealth: Payer: Self-pay | Admitting: Internal Medicine

## 2021-05-31 DIAGNOSIS — I2510 Atherosclerotic heart disease of native coronary artery without angina pectoris: Secondary | ICD-10-CM

## 2021-05-31 MED ORDER — ROSUVASTATIN CALCIUM 20 MG PO TABS: 20.0000 mg | ORAL_TABLET | Freq: Every day | ORAL | 3 refills | Status: DC

## 2021-05-31 NOTE — Telephone Encounter (Signed)
Patient is returning RN's call from Friday 02/17.

## 2021-05-31 NOTE — Telephone Encounter (Signed)
Message from Dr. Gasper Sells:  LDL above goal  Offer increase in rosuvastatin from 10 mg to 20 mg and labs in three months for optimizing heart attack prevention     Reviewed with patient.  She will increase rosuvastatin 20 mg and return 5/16 for lipids/ast.  Sent Crestor 20 mg and made follow up lab appointment.

## 2021-07-22 ENCOUNTER — Ambulatory Visit
Admission: EM | Admit: 2021-07-22 | Discharge: 2021-07-22 | Disposition: A | Discharge status: Home / Self Care | Payer: Medicare HMO | Attending: Family Medicine | Admitting: Family Medicine

## 2021-07-22 DIAGNOSIS — W5501XA Bitten by cat, initial encounter: Secondary | ICD-10-CM | POA: Diagnosis not present

## 2021-07-22 DIAGNOSIS — S61451A Open bite of right hand, initial encounter: Secondary | ICD-10-CM

## 2021-07-22 HISTORY — DX: Hyperlipidemia, unspecified: E78.5

## 2021-07-22 HISTORY — DX: Stricture of artery: I77.1

## 2021-07-22 MED ORDER — AMOXICILLIN-POT CLAVULANATE 875-125 MG PO TABS: 1.0000 | ORAL_TABLET | Freq: Two times a day (BID) | ORAL | 0 refills | Status: AC

## 2021-07-22 NOTE — Discharge Instructions (Addendum)
Take amoxicillin-clavulanate 875 mg--1 tab twice daily with food for 7 days ? ?If your hand becomes suddenly worse or more intensely painful, please proceed to the emergency room for further evaluation ? ?If the cat becomes ill or is acting strange in the next few days please notify animal control ?

## 2021-07-22 NOTE — ED Triage Notes (Signed)
Pt c/o stray cat around house for ~1.5 years. States has been friendly. Today gave it food and cat decided to bite. Pt states "it's too sweet of a cat" to have rabies. States bite is red and swollen, occurred ~11am today.  ?

## 2021-07-22 NOTE — ED Provider Notes (Signed)
?[facility] ? ? ? ?CSN: [id number] ?Arrival date & time: 07/22/21  1733 ? ? ?  ? ?History   ?Chief Complaint ?Chief Complaint  ?Patient presents with  ? Animal Bite  ? ? ?HPI ?Deanna Bell is a 66 y.o. female.  ? ? ?Animal Bite ?Here for pain and swelling in her right hand.  Earlier today a cat that is been living at their house for the last year or so swiped at her and then bit her.  Otherwise it has not been acting ill. ? ?She does not see the need to go to the emergency room for evaluation for rabies shots. ? ?Past Medical History:  ?Diagnosis Date  ? Arterial stenosis ([place])   ? Hyperlipidemia   ? No pertinent past medical history   ? ? ?Patient Active Problem List  ? Diagnosis Date Noted  ? Aortic atherosclerosis ([place]) 05/24/2021  ? Coronary artery disease involving native coronary artery of native heart without angina pectoris 05/24/2021  ? HLD (hyperlipidemia) 10/17/2016  ? Tobacco abuse 01/05/2010  ? ? ?Past Surgical History:  ?Procedure Laterality Date  ? NO PAST SURGERIES    ? ? ?OB History   ?No obstetric history on file. ?  ? ? ? ?Home Medications   ? ?Prior to Admission medications   ?Medication Sig Start Date End Date Taking? Authorizing Provider  ?amoxicillin-clavulanate (AUGMENTIN) 875-125 MG tablet Take 1 tablet by mouth 2 (two) times daily for 7 days. 07/22/21 07/29/21 Yes Barrett Henle, MD  ?rosuvastatin (CRESTOR) 20 MG tablet Take 1 tablet (20 mg total) by mouth daily. 05/31/21   Werner Lean, MD  ?aspirin EC 81 MG tablet Take 1 tablet (81 mg total) by mouth daily. Swallow whole. 05/24/21   Werner Lean, MD  ?cholecalciferol (VITAMIN D) 25 MCG (1000 UNIT) tablet Take 1 tablet by mouth daily at 12 noon.    [provider]  ?fluorouracil (EFUDEX) 5 % cream as needed. Sun damage 01/26/21   [provider]  ?ibuprofen (ADVIL,MOTRIN) 200 MG tablet Take 200 mg by mouth every 6 (six) hours as needed. Reported on 04/13/2015    [provider]   ?tretinoin (RETIN-A) 0.05 % cream Apply topically as needed. 01/20/21   [provider]  ? ? ?Family History ?Family History  ?Problem Relation Age of Onset  ? Breast cancer Mother 3  ? Throat cancer Mother   ? ? ?Social History ?Social History  ? ?Tobacco Use  ? Smoking status: Every Day  ?  Packs/day: 0.50  ?  Years: 38.00  ?  Pack years: 19.00  ?  Types: Cigarettes  ? Smokeless tobacco: Never  ? Tobacco comments:  ?  Down to 4-5 cigs a day, trying to quit - for the last 5 years  ?Substance Use Topics  ? Alcohol use: No  ? Drug use: No  ? ? ? ?Allergies   ?Patient has no known allergies. ? ? ?Review of Systems ?Review of Systems ? ? ?Physical Exam ?Triage Vital Signs ?ED Triage Vitals  ?Enc Vitals Group  ?   BP 07/22/21 1754 140/81  ?   Pulse Rate 07/22/21 1754 86  ?   Resp 07/22/21 1754 18  ?   Temp 07/22/21 1754 97.9 ?F (36.6 ?C)  ?   Temp Source 07/22/21 1754 Oral  ?   SpO2 07/22/21 1754 98 %  ?   Weight --   ?   Height --   ?   Head  Circumference --   ?   Peak Flow --   ?   Pain Score 07/22/21 1755 0  ?   Pain Loc --   ?   Pain Edu? --   ?   Excl. in [place]? --   ? ?No data found. ? ?Updated Vital Signs ?BP 140/81 (BP Location: Left Arm)   Pulse 86   Temp 97.9 ?F (36.6 ?C) (Oral)   Resp 18   SpO2 98%  ? ?Visual Acuity ?Right Eye Distance:   ?Left Eye Distance:   ?Bilateral Distance:   ? ?Right Eye Near:   ?Left Eye Near:    ?Bilateral Near:    ? ?Physical Exam ?Constitutional:   ?   General: She is not in acute distress. ?   Appearance: She is not toxic-appearing.  ?Skin: ?   Comments: There is a puncture wound around near her wrist on the right hand on the dorsal side.  There is also a small linear laceration that is shallow on the dorsum of her right hand about 1 cm in length.  No erythema.  There is some diffuse swelling around each wound  ?Neurological:  ?   Mental Status: She is alert.  ? ? ? ?UC Treatments / Results  ?Labs ?(all labs ordered are listed, but only abnormal results are  displayed) ?Labs Reviewed - No data to display ? ?EKG ? ? ?Radiology ?No results found. ? ?Procedures ?Procedures (including critical care time) ? ?Medications Ordered in UC ?Medications - No data to display ? ?Initial Impression / Assessment and Plan / UC Course  ?I have reviewed the triage vital signs and the nursing notes. ? ?Pertinent labs & imaging results that were available during my care of the patient were reviewed by me and considered in my medical decision making (see chart for details). ? ?  ? ?Will treat with Augmentin; warnings given for worsening. She states she will observe the cat ?Final Clinical Impressions(s) / UC Diagnoses  ? ?Final diagnoses:  ?Cat bite of right hand, initial encounter  ? ? ? ?Discharge Instructions   ? ?  ?Take amoxicillin-clavulanate 875 mg--1 tab twice daily with food for 7 days ? ?If your hand becomes suddenly worse or more intensely painful, please proceed to the emergency room for further evaluation ? ?If the cat becomes ill or is acting strange in the next few days please notify animal control ? ? ? ? ?ED Prescriptions   ? ? Medication Sig Dispense Auth. Provider  ? amoxicillin-clavulanate (AUGMENTIN) 875-125 MG tablet Take 1 tablet by mouth 2 (two) times daily for 7 days. 14 tablet Tarynn Garling, Gwenlyn Perking, MD  ? ?  ? ?PDMP not reviewed this encounter. ?  ?Barrett Henle, MD ?07/22/21 1813 ? ?

## 2021-08-23 ENCOUNTER — Other Ambulatory Visit: Payer: Medicare HMO | Admitting: *Deleted

## 2021-08-23 DIAGNOSIS — I2510 Atherosclerotic heart disease of native coronary artery without angina pectoris: Secondary | ICD-10-CM | POA: Diagnosis not present

## 2021-08-23 LAB — LIPID PANEL
Chol/HDL Ratio: 2.3 ratio (ref 0.0–4.4)
Cholesterol, Total: 151 mg/dL (ref 100–199)
HDL: 65 mg/dL (ref >39)
LDL Chol Calc (NIH): 69 mg/dL (ref 0–99)
Triglycerides: 91 mg/dL (ref 0–149)
VLDL Cholesterol Cal: 17 mg/dL (ref 5–40)

## 2021-08-23 LAB — ALT: ALT: 61 IU/L — ABNORMAL HIGH (ref 0–32)

## 2021-08-25 ENCOUNTER — Telehealth: Payer: Self-pay | Admitting: Internal Medicine

## 2021-08-25 DIAGNOSIS — I2510 Atherosclerotic heart disease of native coronary artery without angina pectoris: Secondary | ICD-10-CM

## 2021-08-25 DIAGNOSIS — E785 Hyperlipidemia, unspecified: Secondary | ICD-10-CM

## 2021-08-25 DIAGNOSIS — I700 Atherosclerosis of aorta: Secondary | ICD-10-CM

## 2021-08-25 MED ORDER — ROSUVASTATIN CALCIUM 10 MG PO TABS: 10.0000 mg | ORAL_TABLET | Freq: Every day | ORAL | 3 refills | Status: DC

## 2021-08-25 NOTE — Telephone Encounter (Signed)
   Pt said, she has questions about her lab results

## 2021-08-25 NOTE — Telephone Encounter (Signed)
The patient has been notified of the result and verbalized understanding.  All questions (if any) were answered. Precious Gilding, RN 08/25/2021 11:10 AM

## 2021-08-25 NOTE — Telephone Encounter (Signed)
-----   Message from Werner Lean, MD sent at 08/24/2021  5:20 PM EDT ----- Results: ALT slightly elevated, lipids improved Plan: Decrease to rosuvastatin 10 mg and labs in three months  Werner Lean, MD

## 2021-11-01 DIAGNOSIS — Z1231 Encounter for screening mammogram for malignant neoplasm of breast: Secondary | ICD-10-CM | POA: Diagnosis not present

## 2021-11-29 ENCOUNTER — Other Ambulatory Visit: Payer: Medicare HMO

## 2021-11-29 DIAGNOSIS — E785 Hyperlipidemia, unspecified: Secondary | ICD-10-CM

## 2021-11-29 DIAGNOSIS — I2510 Atherosclerotic heart disease of native coronary artery without angina pectoris: Secondary | ICD-10-CM | POA: Diagnosis not present

## 2021-11-29 DIAGNOSIS — I700 Atherosclerosis of aorta: Secondary | ICD-10-CM | POA: Diagnosis not present

## 2021-11-29 LAB — LIPID PANEL
Chol/HDL Ratio: 2.6 ratio (ref 0.0–4.4)
Cholesterol, Total: 157 mg/dL (ref 100–199)
HDL: 60 mg/dL (ref >39)
LDL Chol Calc (NIH): 80 mg/dL (ref 0–99)
Triglycerides: 93 mg/dL (ref 0–149)
VLDL Cholesterol Cal: 17 mg/dL (ref 5–40)

## 2021-11-29 LAB — ALT: ALT: 10 IU/L (ref 0–32)

## 2021-12-01 ENCOUNTER — Encounter: Payer: Self-pay | Admitting: Internal Medicine

## 2021-12-01 DIAGNOSIS — I2510 Atherosclerotic heart disease of native coronary artery without angina pectoris: Secondary | ICD-10-CM

## 2021-12-01 DIAGNOSIS — I700 Atherosclerosis of aorta: Secondary | ICD-10-CM

## 2021-12-01 DIAGNOSIS — E785 Hyperlipidemia, unspecified: Secondary | ICD-10-CM

## 2021-12-01 MED ORDER — EZETIMIBE 10 MG PO TABS: 10.0000 mg | ORAL_TABLET | Freq: Every day | ORAL | 3 refills | Status: DC

## 2022-01-02 ENCOUNTER — Ambulatory Visit: Payer: BC Managed Care – PPO

## 2022-01-05 ENCOUNTER — Encounter: Payer: Self-pay | Admitting: Family Medicine

## 2022-01-05 ENCOUNTER — Ambulatory Visit: Payer: BC Managed Care – PPO

## 2022-01-05 ENCOUNTER — Ambulatory Visit (INDEPENDENT_AMBULATORY_CARE_PROVIDER_SITE_OTHER): Payer: Medicare HMO | Admitting: Family Medicine

## 2022-01-05 VITALS — BP 136/78 | HR 88 | Temp 97.6°F | Ht 61.0 in | Wt 138.0 lb

## 2022-01-05 DIAGNOSIS — Z720 Tobacco use: Secondary | ICD-10-CM

## 2022-01-05 DIAGNOSIS — Z23 Encounter for immunization: Secondary | ICD-10-CM

## 2022-01-05 DIAGNOSIS — E785 Hyperlipidemia, unspecified: Secondary | ICD-10-CM

## 2022-01-05 DIAGNOSIS — Z0000 Encounter for general adult medical examination without abnormal findings: Secondary | ICD-10-CM

## 2022-01-05 DIAGNOSIS — Z1211 Encounter for screening for malignant neoplasm of colon: Secondary | ICD-10-CM

## 2022-01-05 LAB — CBC
HCT: 39.9 % (ref 36.0–46.0)
Hemoglobin: 13.4 g/dL (ref 12.0–15.0)
MCHC: 33.5 g/dL (ref 30.0–36.0)
MCV: 93 fl (ref 78.0–100.0)
Platelets: 162 10*3/uL (ref 150.0–400.0)
RBC: 4.29 Mil/uL (ref 3.87–5.11)
RDW: 13.4 % (ref 11.5–15.5)
WBC: 5.5 10*3/uL (ref 4.0–10.5)

## 2022-01-05 LAB — COMPREHENSIVE METABOLIC PANEL
ALT: 12 U/L (ref 0–35)
AST: 21 U/L (ref 0–37)
Albumin: 4.4 g/dL (ref 3.5–5.2)
Alkaline Phosphatase: 54 U/L (ref 39–117)
BUN: 16 mg/dL (ref 6–23)
CO2: 31 mEq/L (ref 19–32)
Calcium: 9.3 mg/dL (ref 8.4–10.5)
Chloride: 102 mEq/L (ref 96–112)
Creatinine, Ser: 0.84 mg/dL (ref 0.40–1.20)
GFR: 72.27 mL/min (ref >60.00)
Glucose, Bld: 84 mg/dL (ref 70–99)
Potassium: 4.3 mEq/L (ref 3.5–5.1)
Sodium: 138 mEq/L (ref 135–145)
Total Bilirubin: 0.5 mg/dL (ref 0.2–1.2)
Total Protein: 7.3 g/dL (ref 6.0–8.3)

## 2022-01-05 NOTE — Patient Instructions (Signed)
Vaccines at the pharmacy - Shingles and Covid Booster

## 2022-01-05 NOTE — Progress Notes (Signed)
Subjective:   Deanna Bell is a 66 y.o. female who presents for Medicare Annual (Subsequent) preventive examination.  Review of Systems    Review of Systems  Constitutional:  Negative for chills and fever.  HENT:  Negative for congestion and sore throat.   Eyes:  Negative for blurred vision and double vision.  Respiratory:  Negative for shortness of breath.   Cardiovascular:  Negative for chest pain.  Gastrointestinal:  Negative for heartburn, nausea and vomiting.  Genitourinary: Negative.   Musculoskeletal: Negative.  Negative for myalgias.  Skin:  Negative for rash.  Neurological:  Negative for dizziness and headaches.  Endo/Heme/Allergies:  Does not bruise/bleed easily.  Psychiatric/Behavioral:  Negative for depression. The patient is not nervous/anxious.     Cardiac Risk Factors include: advanced age (>58mn, >>71women);smoking/ tobacco exposure     Objective:    Today's Vitals   01/05/22 0808  BP: 136/78  Pulse: 88  Temp: 97.6 F (36.4 C)  TempSrc: Oral  SpO2: 97%  Weight: 138 lb (62.6 kg)  Height: '5\' 1"'$  (1.549 m)   Body mass index is 26.07 kg/m.     01/05/2022    8:19 AM 01/04/2021    8:44 AM  Advanced Directives  Does Patient Have a Medical Advance Directive? No;Yes No  Type of Advance Directive Living will   Does patient want to make changes to medical advance directive? No - Patient declined Yes (MAU/Ambulatory/Procedural Areas - Information given)  Would patient like information on creating a medical advance directive? Yes (MAU/Ambulatory/Procedural Areas - Information given)     Social History   Tobacco Use  Smoking Status Every Day   Packs/day: 0.50   Years: 38.00   Total pack years: 19.00   Types: Cigarettes  Smokeless Tobacco Never  Tobacco Comments   Down to 4-5 cigs a day, trying to quit - for the last 5 years     Current Medications (verified) Outpatient Encounter Medications as of 01/05/2022  Medication Sig   aspirin EC 81 MG  tablet Take 1 tablet (81 mg total) by mouth daily. Swallow whole.   cholecalciferol (VITAMIN D) 25 MCG (1000 UNIT) tablet Take 1 tablet by mouth daily at 12 noon.   ezetimibe (ZETIA) 10 MG tablet Take 1 tablet (10 mg total) by mouth daily.   fluorouracil (EFUDEX) 5 % cream as needed. Sun damage   rosuvastatin (CRESTOR) 10 MG tablet Take 1 tablet (10 mg total) by mouth daily.   ibuprofen (ADVIL,MOTRIN) 200 MG tablet Take 200 mg by mouth every 6 (six) hours as needed. Reported on 04/13/2015 (Patient not taking: Reported on 01/05/2022)   tretinoin (RETIN-A) 0.05 % cream Apply topically as needed. (Patient not taking: Reported on 01/05/2022)   No facility-administered encounter medications on file as of 01/05/2022.    Allergies (verified) Patient has no known allergies.   History: Past Medical History:  Diagnosis Date   Arterial stenosis ([facility])    Hyperlipidemia    No pertinent past medical history    Past Surgical History:  Procedure Laterality Date   NO PAST SURGERIES     Family History  Problem Relation Age of Onset   Breast cancer Mother 737  Throat cancer Mother    Social History   Socioeconomic History   Marital status: Married    Spouse name: Deanna Bell  Number of children: 1   Years of education: high school   Highest education level: Not on file  Occupational History    Employer: unemployed  Tobacco Use   Smoking status: Every Day    Packs/day: 0.50    Years: 38.00    Total pack years: 19.00    Types: Cigarettes   Smokeless tobacco: Never   Tobacco comments:    Down to 4-5 cigs a day, trying to quit - for the last 5 years  Substance and Sexual Activity   Alcohol use: No   Drug use: No   Sexual activity: Not Currently    Birth control/protection: Post-menopausal  Other Topics Concern   Not on file  Social History Narrative   06/02/19   From: the area   Living: Husband, Deanna Bell   Work: retired, was a Systems developer      Family: Son - Deanna Bell - 3 grandchildren who  live nearby      Enjoys: yardwork, gardening      Exercise: workouts in the morning 10-30 minutes, has knee pain, walks 30 minutes for her dog   Diet: meat, veggies, a lot of sweets      Safety   Seat belts: Yes    Guns: Yes  and secure   Safe in relationships: Yes    Social Determinants of Radio broadcast assistant Strain: Not on file  Food Insecurity: Not on file  Transportation Needs: Not on file  Physical Activity: Not on file  Stress: Not on file  Social Connections: Not on file    Tobacco Counseling Ready to quit: Not Answered Counseling given: Not Answered Tobacco comments: Down to 4-5 cigs a day, trying to quit - for the last 5 years   Clinical Intake:  Pre-visit preparation completed: No  Pain : No/denies pain     BMI - recorded: 26.07 Nutritional Status: BMI 25 -29 Overweight Nutritional Risks: None Diabetes: No  How often do you need to have someone help you when you read instructions, pamphlets, or other written materials from your doctor or pharmacy?: 1 - Never  Diabetic?no  Interpreter Needed?: No      Activities of Daily Living    01/05/2022    8:21 AM 01/05/2022    8:10 AM  In your present state of health, do you have any difficulty performing the following activities:  Hearing?  0  Vision?  0  Difficulty concentrating or making decisions?  0  Walking or climbing stairs?  0  Dressing or bathing?  0  Doing errands, shopping?  0  Preparing Food and eating ? N   Using the Toilet? N   In the past six months, have you accidently leaked urine? N   Do you have problems with loss of bowel control? N   Managing your Medications? N   Managing your Finances? N   Housekeeping or managing your Housekeeping? N     Patient Care Team: Lesleigh Noe, MD as PCP - General (Family Medicine) Werner Lean, MD as PCP - Cardiology (Cardiology) Paula Compton, MD as Consulting Physician (Obstetrics and Gynecology) Sydnee Levans,  MD as Referring Physician (Dermatology)  Indicate any recent Medical Services you may have received from other than [facility] providers in the past year (date may be approximate).     Assessment:   This is a routine wellness examination for Deanna Bell.  Hearing/Vision screen No results found.  Dietary issues and exercise activities discussed: Current Exercise Habits: Home exercise routine, Type of exercise: walking, Time (Minutes): 60, Frequency (Times/Week): 7, Weekly Exercise (Minutes/Week): 420, Intensity: Moderate   Goals Addressed  This Visit's Progress    Patient Stated       Continue walking 30-45 minutes daily       Depression Screen    01/05/2022    8:10 AM 01/04/2021    8:27 AM 01/01/2020   11:43 AM 12/24/2018   10:06 AM 12/12/2017    8:58 AM 10/13/2015    9:32 AM 07/28/2014    9:15 AM  PHQ 2/9 Scores  PHQ - 2 Score 0 0 0 0 0 0 0  PHQ- 9 Score 0          Fall Risk    01/05/2022    8:10 AM 01/04/2021    8:27 AM 12/12/2017    8:58 AM  [place] in the past year? 0 0 No  Number falls in past yr: 0 0   Injury with Fall? 0      FALL RISK PREVENTION PERTAINING TO THE HOME:  Any stairs in or around the home? Yes  If so, are there any without handrails? Yes  Home free of loose throw rugs in walkways, pet beds, electrical cords, etc? Yes  Adequate lighting in your home to reduce risk of falls? Yes   ASSISTIVE DEVICES UTILIZED TO PREVENT FALLS:  Life alert? No  Use of a cane, walker or w/c? No  Grab bars in the bathroom? No  Shower chair or bench in shower? Yes  Elevated toilet seat or a handicapped toilet? Yes    Cognitive Function:      Mini-Cog - 01/05/22 0822     Normal clock drawing test? yes    How many words correct? 3                Immunizations Immunization History  Administered Date(s) Administered   Fluad Quad(high Dose 65+) 01/04/2021   Influenza Whole 01/05/2010   Influenza,inj,Quad PF,6+ Mos 12/12/2013, 01/01/2015,  12/24/2018, 01/01/2020   PFIZER(Purple Top)SARS-COV-2 Vaccination 06/22/2019, 07/14/2019   Pneumococcal Polysaccharide-23 01/01/2020   Td 01/05/2010   Tdap 01/01/2020    TDAP status: Up to date  Flu Vaccine status: Completed at today's visit  Pneumococcal vaccine status: Up to date  Covid-19 vaccine status: Information provided on how to obtain vaccines.   Qualifies for Shingles Vaccine? Yes   Zostavax completed No   Shingrix Completed?: Yes  Screening Tests Health Maintenance  Topic Date Due   Zoster Vaccines- Shingrix (1 of 2) Never done   Pneumonia Vaccine 27+ Years old (2 - PCV) 12/31/2020   INFLUENZA VACCINE  11/08/2021   Fecal DNA (Cologuard)  12/30/2021   COVID-19 Vaccine (3 - Pfizer series) 01/21/2022 (Originally 09/08/2019)   DEXA SCAN  01/06/2023 (Originally 04/27/2020)   MAMMOGRAM  10/27/2022   TETANUS/TDAP  12/31/2029   Hepatitis C Screening  Completed   HPV VACCINES  Aged Out    Health Maintenance  Health Maintenance Due  Topic Date Due   Zoster Vaccines- Shingrix (1 of 2) Never done   Pneumonia Vaccine 64+ Years old (2 - PCV) 12/31/2020   INFLUENZA VACCINE  11/08/2021   Fecal DNA (Cologuard)  12/30/2021    Colorectal cancer screening: Type of screening: Cologuard. Completed 2020. Repeat every 3 years  Mammogram status: Completed 10/2021. Repeat every year  Bone Density status: Completed 5 years ago. Results reflect: Bone density results: NORMAL. Repeat every 5-7 years.  Lung Cancer Screening: (Low Dose CT Chest recommended if Age 48-80 years, 30 pack-year currently smoking OR have quit w/in 15years.) does not qualify.  Social History   Tobacco Use  Smoking Status Every Day   Packs/day: 0.50   Years: 38.00   Total pack years: 19.00   Types: Cigarettes  Smokeless Tobacco Never  Tobacco Comments   Down to 4-5 cigs a day, trying to quit - for the last 5 years     Lung Cancer Screening Referral: n/a  Additional Screening:  Hepatitis C  Screening: does qualify; Completed 12/2018  Vision Screening: Recommended annual ophthalmology exams for early detection of glaucoma and other disorders of the eye. Is the patient up to date with their annual eye exam?  Yes    Dental Screening: Recommended annual dental exams for proper oral hygiene  Community Resource Referral / Chronic Care Management: CRR required this visit?  No   CCM required this visit?  No      Plan:     Problem List Items Addressed This Visit       Other   Tobacco abuse   Relevant Orders   CBC   HLD (hyperlipidemia)   Relevant Orders   Comprehensive metabolic panel   Other Visit Diagnoses     Encounter for Medicare annual wellness exam    -  Primary   Colon cancer screening       Relevant Orders   Cologuard   Need for pneumococcal vaccination       Relevant Orders   Pneumococcal conjugate vaccine 20-valent (Prevnar 20) (Completed)   Need for immunization against influenza       Relevant Orders   Flu Vaccine QUAD High Dose(Fluad) (Completed)        I have personally reviewed and noted the following in the patient's chart:   Medical and social history Use of alcohol, tobacco or illicit drugs  Current medications and supplements including opioid prescriptions. Patient is not currently taking opioid prescriptions. Functional ability and status Nutritional status Physical activity Advanced directives List of other physicians Hospitalizations, surgeries, and ER visits in previous 12 months Vitals Screenings to include cognitive, depression, and falls Referrals and appointments  In addition, I have reviewed and discussed with patient certain preventive protocols, quality metrics, and best practice recommendations. A written personalized care plan for preventive services as well as general preventive health recommendations were provided to patient.     Lesleigh Noe, MD   01/05/2022

## 2022-01-11 DIAGNOSIS — Z1211 Encounter for screening for malignant neoplasm of colon: Secondary | ICD-10-CM | POA: Diagnosis not present

## 2022-01-11 IMAGING — CT CT HEART MORP W/ CTA COR W/ SCORE W/ CA W/CM &/OR W/O CM
4 of 7 series · 8 of 20 positions shown, 9 images · IV contrast (APPLIED)
Comparison: None.
COMPARISON: None.

Addendum:
EXAM:
OVER-READ INTERPRETATION  CT CHEST

The following report is an over-read performed by radiologist Dr.
Pinto Leal Hencks [[email or web address]] on 03/17/2021. This
over-read does not include interpretation of cardiac or coronary
anatomy or pathology. The coronary calcium score/coronary CTA
interpretation by the cardiologist is attached.
HISTORY: Chest pain, nonspecific
Cardiac/Coronary CT
TECHNIQUE: The patient was scanned on a Siemens Force scanner.
PROTOCOL: A 120 kV prospective scan was triggered in the descending thoracic
aorta at 111 HU's. Axial non-contrast 3 mm slices were carried out
through the heart. The data set was analyzed on a dedicated work
station and scored using the Agatson method. Gantry rotation speed
was 250 msecs and collimation was 0.6 mm. Heart rate optimized
medically, and 0.8 mg of sublingual nitroglycerin was given. The 3D
data set was reconstructed in 5% intervals of 35-75% of the R-R
cycle. Diastolic phases were analyzed on a dedicated work station
using MPR, MIP and VRT modes. The patient received 95mL OMNIPAQUE
IOHEXOL 350 MG/ML SOLN of contrast.

[Series 6: best diast · axial · 0.39mm/px · z∈[+1159,+1203]mm · 2 of 328 slices shown, 3 images]
[im 110/328  vessel]
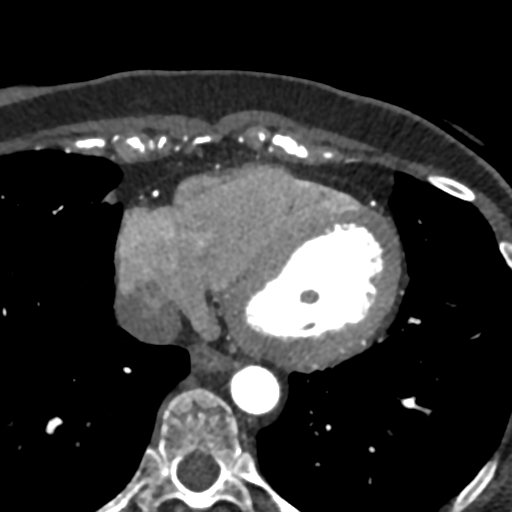
[im 110/328  lung]
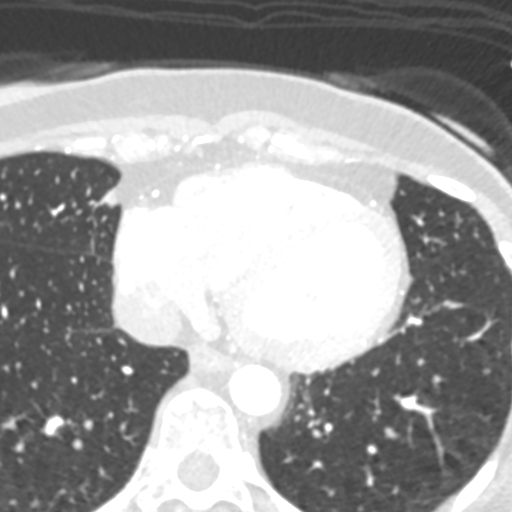
[im 219/328  vessel]
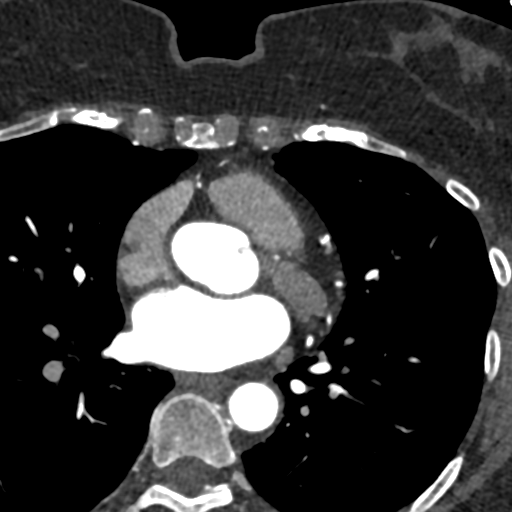

[Series 7: best syst · axial · 0.39mm/px · z∈[+1159,+1203]mm · 2 of 328 slices shown]
[im 110/328  vessel]
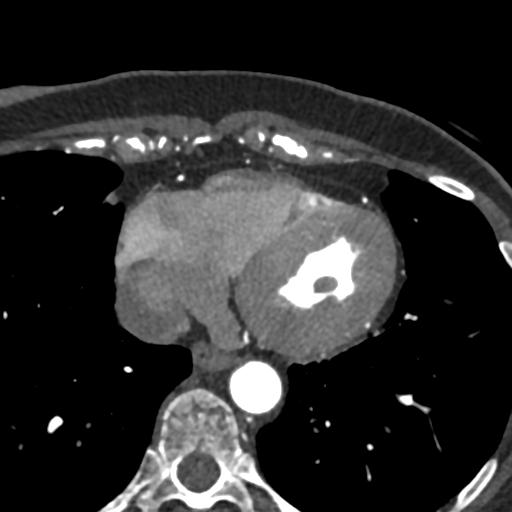
[im 219/328  vessel]
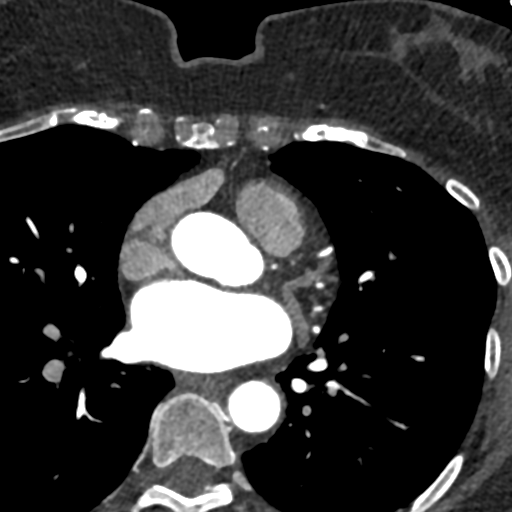

[Series 8: ts diast sharp · axial · 0.39mm/px · z∈[+1159,+1203]mm · 2 of 328 slices shown]
[im 110/328  lung]
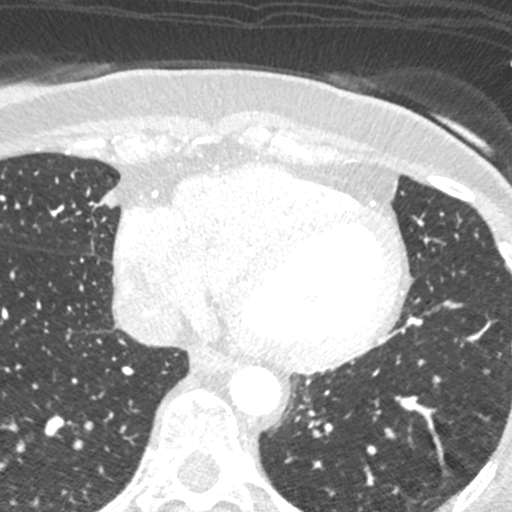
[im 219/328  lung]
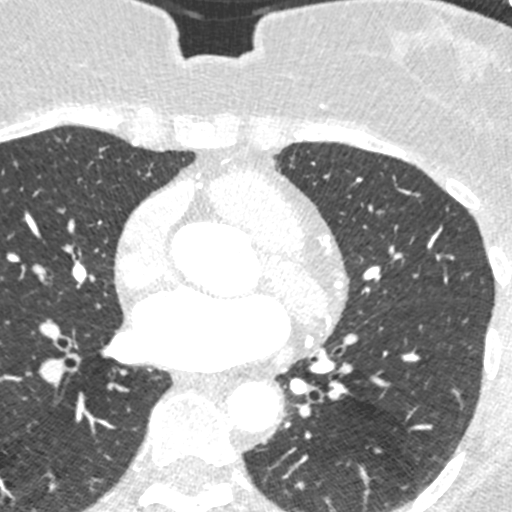

[Series 9: ts syst sharp · axial · 0.39mm/px · z∈[+1159,+1203]mm · 2 of 328 slices shown]
[im 110/328  lung]
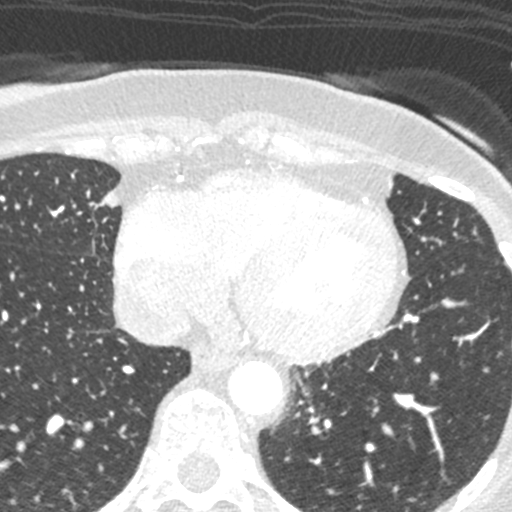
[im 219/328  lung]
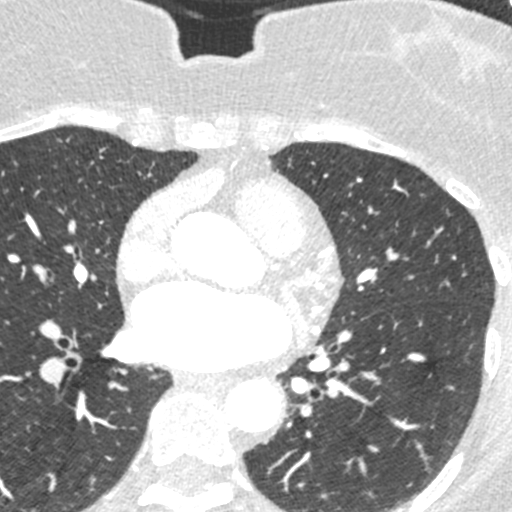

[8 of 20 positions shown; findings below may reference images not displayed]

FINDINGS: Aortic atherosclerosis scratch the atherosclerotic calcifications in
the thoracic aorta. Within the visualized portions of the thorax
there are no suspicious appearing pulmonary nodules or masses, there
is no acute consolidative airspace disease, no pleural effusions, no
pneumothorax and no lymphadenopathy. Visualized portions of the
upper abdomen are unremarkable. There are no aggressive appearing
lytic or blastic lesions noted in the visualized portions of the
skeleton.
IMPRESSION: 1.  Aortic Atherosclerosis (DFYOP-WVD.D).
FINDINGS: Coronary calcium score: The patient's coronary artery calcium score
is 30, which places the patient in the 68th percentile.

Coronary arteries: Normal coronary origins.  Co-dominance.

Right Coronary Artery: Normal caliber vessel, gives rise to right
PDA. No significant plaque or stenosis.

Left Main Coronary Artery: Normal caliber vessel. No significant
plaque or stenosis.

Left Anterior Descending Coronary Artery: Normal caliber vessel.
There is a small, focal, calcified plaque in the mid LAD with 1-24%
stenosis. Gives rise to large fist and normal second diagonal
branches. Distal LAD wraps apex.

Left Circumflex Artery: Normal caliber vessel, gives rise to left
PDA. No significant plaque or stenosis. Gives rise to 3 OM branches.

Aorta: Normal size, 27 mm at the mid ascending aorta (level of the
PA bifurcation) measured double oblique. Scattered calcifications
consistent with aortic atherosclerosis. No dissection seen in
visualized portions of the aorta.

Aortic Valve: No calcifications. Trileaflet.

Other findings:

Normal pulmonary vein drainage into the left atrium.

Normal left atrial appendage without a thrombus.

Normal size of the pulmonary artery.

Normal appearance of the pericardium.
IMPRESSION: 1.  Minimal nonobstructive CAD, CADRADS = 1.

2. Coronary calcium score of 30. This was 68th percentile for age
and sex matched control.

3. Normal coronary origin with co-dominance.

INTERPRETATION:

1. CAD-RADS 0: No evidence of CAD (0%). Consider non-atherosclerotic
causes of chest pain.

2. CAD-RADS 1: Minimal non-obstructive CAD (0-24%). Consider
non-atherosclerotic causes of chest pain. Consider preventive
therapy and risk factor modification.

3. CAD-RADS 2: Mild non-obstructive CAD (25-49%). Consider
non-atherosclerotic causes of chest pain. Consider preventive
therapy and risk factor modification.

4. CAD-RADS 3: Moderate stenosis (50-69%). Consider symptom-guided
anti-ischemic pharmacotherapy as well as risk factor modification
per guideline directed care. Additional analysis with CT FFR will be
submitted.

5. CAD-RADS 4: Severe stenosis. (70-99% or > 50% left main). Cardiac
catheterization or CT FFR is recommended. Consider symptom-guided
anti-ischemic pharmacotherapy as well as risk factor modification
per guideline directed care. Invasive coronary angiography
recommended with revascularization per published guideline
statements.

6. CAD-RADS 5: Total coronary occlusion (100%). Consider cardiac
catheterization or viability assessment. Consider symptom-guided
anti-ischemic pharmacotherapy as well as risk factor modification
per guideline directed care.

7. CAD-RADS N: Non-diagnostic study. Obstructive CAD can't be
excluded. Alternative evaluation is recommended.

*** End of Addendum ***
EXAM:
OVER-READ INTERPRETATION  CT CHEST

The following report is an over-read performed by radiologist Dr.
Pinto Leal Hencks [[email or web address]] on 03/17/2021. This
over-read does not include interpretation of cardiac or coronary
anatomy or pathology. The coronary calcium score/coronary CTA
interpretation by the cardiologist is attached.
FINDINGS: Aortic atherosclerosis scratch the atherosclerotic calcifications in
the thoracic aorta. Within the visualized portions of the thorax
there are no suspicious appearing pulmonary nodules or masses, there
is no acute consolidative airspace disease, no pleural effusions, no
pneumothorax and no lymphadenopathy. Visualized portions of the
upper abdomen are unremarkable. There are no aggressive appearing
lytic or blastic lesions noted in the visualized portions of the
skeleton.
IMPRESSION: 1.  Aortic Atherosclerosis (DFYOP-WVD.D).

## 2022-01-18 ENCOUNTER — Other Ambulatory Visit: Payer: Self-pay | Admitting: Family

## 2022-01-18 DIAGNOSIS — R195 Other fecal abnormalities: Secondary | ICD-10-CM

## 2022-01-18 LAB — COLOGUARD: COLOGUARD: POSITIVE — AB

## 2022-01-30 DIAGNOSIS — L820 Inflamed seborrheic keratosis: Secondary | ICD-10-CM | POA: Diagnosis not present

## 2022-01-30 DIAGNOSIS — D225 Melanocytic nevi of trunk: Secondary | ICD-10-CM | POA: Diagnosis not present

## 2022-01-30 DIAGNOSIS — D485 Neoplasm of uncertain behavior of skin: Secondary | ICD-10-CM | POA: Diagnosis not present

## 2022-01-30 DIAGNOSIS — C44722 Squamous cell carcinoma of skin of right lower limb, including hip: Secondary | ICD-10-CM | POA: Diagnosis not present

## 2022-01-30 DIAGNOSIS — Z85828 Personal history of other malignant neoplasm of skin: Secondary | ICD-10-CM | POA: Diagnosis not present

## 2022-01-30 DIAGNOSIS — L814 Other melanin hyperpigmentation: Secondary | ICD-10-CM | POA: Diagnosis not present

## 2022-01-30 DIAGNOSIS — L821 Other seborrheic keratosis: Secondary | ICD-10-CM | POA: Diagnosis not present

## 2022-01-30 DIAGNOSIS — L570 Actinic keratosis: Secondary | ICD-10-CM | POA: Diagnosis not present

## 2022-01-30 DIAGNOSIS — C44729 Squamous cell carcinoma of skin of left lower limb, including hip: Secondary | ICD-10-CM | POA: Diagnosis not present

## 2022-02-06 DIAGNOSIS — K640 First degree hemorrhoids: Secondary | ICD-10-CM | POA: Diagnosis not present

## 2022-02-06 DIAGNOSIS — K5900 Constipation, unspecified: Secondary | ICD-10-CM | POA: Diagnosis not present

## 2022-02-16 DIAGNOSIS — H524 Presbyopia: Secondary | ICD-10-CM | POA: Diagnosis not present

## 2022-02-16 DIAGNOSIS — H5203 Hypermetropia, bilateral: Secondary | ICD-10-CM | POA: Diagnosis not present

## 2022-03-07 ENCOUNTER — Ambulatory Visit: Payer: Medicare HMO | Attending: Internal Medicine

## 2022-03-07 DIAGNOSIS — E785 Hyperlipidemia, unspecified: Secondary | ICD-10-CM | POA: Diagnosis not present

## 2022-03-07 DIAGNOSIS — I700 Atherosclerosis of aorta: Secondary | ICD-10-CM

## 2022-03-07 DIAGNOSIS — I2510 Atherosclerotic heart disease of native coronary artery without angina pectoris: Secondary | ICD-10-CM

## 2022-03-07 LAB — LIPID PANEL
Chol/HDL Ratio: 2.2 ratio (ref 0.0–4.4)
Cholesterol, Total: 143 mg/dL (ref 100–199)
HDL: 65 mg/dL (ref >39)
LDL Chol Calc (NIH): 63 mg/dL (ref 0–99)
Triglycerides: 75 mg/dL (ref 0–149)
VLDL Cholesterol Cal: 15 mg/dL (ref 5–40)

## 2022-03-14 DIAGNOSIS — D127 Benign neoplasm of rectosigmoid junction: Secondary | ICD-10-CM | POA: Diagnosis not present

## 2022-03-14 DIAGNOSIS — K5730 Diverticulosis of large intestine without perforation or abscess without bleeding: Secondary | ICD-10-CM | POA: Diagnosis not present

## 2022-03-14 DIAGNOSIS — Z1211 Encounter for screening for malignant neoplasm of colon: Secondary | ICD-10-CM | POA: Diagnosis not present

## 2022-03-14 DIAGNOSIS — K635 Polyp of colon: Secondary | ICD-10-CM | POA: Diagnosis not present

## 2022-03-14 DIAGNOSIS — R195 Other fecal abnormalities: Secondary | ICD-10-CM | POA: Diagnosis not present

## 2022-03-14 LAB — HM COLONOSCOPY

## 2022-03-23 ENCOUNTER — Encounter: Payer: Self-pay | Admitting: Family

## 2022-05-13 ENCOUNTER — Other Ambulatory Visit: Payer: Self-pay | Admitting: Internal Medicine

## 2022-05-22 ENCOUNTER — Encounter: Payer: Self-pay | Admitting: Internal Medicine

## 2022-05-22 ENCOUNTER — Ambulatory Visit: Payer: Medicare HMO | Attending: Internal Medicine | Admitting: Internal Medicine

## 2022-05-22 VITALS — BP 118/60 | HR 70 | Ht 61.0 in | Wt 138.0 lb

## 2022-05-22 DIAGNOSIS — R011 Cardiac murmur, unspecified: Secondary | ICD-10-CM | POA: Diagnosis not present

## 2022-05-22 DIAGNOSIS — E785 Hyperlipidemia, unspecified: Secondary | ICD-10-CM

## 2022-05-22 DIAGNOSIS — I2510 Atherosclerotic heart disease of native coronary artery without angina pectoris: Secondary | ICD-10-CM

## 2022-05-22 DIAGNOSIS — F17200 Nicotine dependence, unspecified, uncomplicated: Secondary | ICD-10-CM | POA: Diagnosis not present

## 2022-05-22 DIAGNOSIS — I700 Atherosclerosis of aorta: Secondary | ICD-10-CM

## 2022-05-22 MED ORDER — ASPIRIN 81 MG PO TBEC: 81.0000 mg | DELAYED_RELEASE_TABLET | Freq: Every day | ORAL | 3 refills | Status: DC

## 2022-05-22 NOTE — Patient Instructions (Signed)
Medication Instructions:  Your physician recommends that you continue on your current medications as directed. Please refer to the Current Medication list given to you today.  *If you need a refill on your cardiac medications before your next appointment, please call your pharmacy*   Lab Work: NONE If you have labs (blood work) drawn today and your tests are completely normal, you will receive your results only by: [place] (if you have MyChart) OR A paper copy in the mail If you have any lab test that is abnormal or we need to change your treatment, we will call you to review the results.   Testing/Procedures: Your physician has requested that you have an echocardiogram. Echocardiography is a painless test that uses sound waves to create images of your heart. It provides your doctor with information about the size and shape of your heart and how well your heart's chambers and valves are working. This procedure takes approximately one hour. There are no restrictions for this procedure. Please do NOT wear cologne, perfume, aftershave, or lotions (deodorant is allowed). Please arrive 15 minutes prior to your appointment time.  Your physician has requested that you have a CT of your lungs.    Follow-Up: At [facility], you and your health needs are our priority.  As part of our continuing mission to provide you with exceptional heart care, we have created designated Provider Care Teams.  These Care Teams include your primary Cardiologist (physician) and Advanced Practice Providers (APPs -  Physician Assistants and Nurse Practitioners) who all work together to provide you with the care you need, when you need it.  We recommend signing up for the patient portal called "MyChart".  Sign up information is provided on this After Visit Summary.  MyChart is used to connect with patients for Virtual Visits (Telemedicine).  Patients are able to view lab/test results, encounter notes,  upcoming appointments, etc.  Non-urgent messages can be sent to your provider as well.   To learn more about what you can do with MyChart, go to [email or web address].    Your next appointment:   1 year(s)  Provider:   Werner Lean, MD

## 2022-05-22 NOTE — Progress Notes (Signed)
Cardiology Office Note:    Date:  05/22/2022   ID:  Deanna Bell, DOB [birth year], MRN [id number]  PCP:  Waunita Schooner, MD   [facility] Providers Cardiologist:  Werner Lean, MD     Referring MD: Waunita Schooner, MD   CC: CP follow Up  History of Present Illness:    Deanna Bell is a 67 y.o. female with a hx of HLD and tobacco abuse.  Seen in 2022 with minimal no obstructive CAD after CCTA. 2023: Working on smoking cessation  Patient notes that she is doing great.   There are no interval hospital/ED visit.   She has continued to cut back on her smoking. She now only smokes when she goes to out to eat lunch; they also smoke.  She has smoked four time in February.  Rare chest discomfort when picking up boxes.  No discomfort when walking.  No SOB/DOE and no PND/Orthopnea.  No weight gain or leg swelling.  No palpitations or syncope .   Past Medical History:  Diagnosis Date   Arterial stenosis (HCC)    Hyperlipidemia    No pertinent past medical history     Past Surgical History:  Procedure Laterality Date   NO PAST SURGERIES      Current Medications: Current Meds  Medication Sig   cholecalciferol (VITAMIN D) 25 MCG (1000 UNIT) tablet Take 1 tablet by mouth daily at 12 noon.   ezetimibe (ZETIA) 10 MG tablet Take 1 tablet (10 mg total) by mouth daily.   rosuvastatin (CRESTOR) 10 MG tablet Take 1 tablet (10 mg total) by mouth daily.   [DISCONTINUED] aspirin EC ([facility] ASPIRIN LOW DOSE) 81 MG tablet TAKE 1 TABLET (81 MG TOTAL) BY MOUTH DAILY. SWALLOW WHOLE.   [DISCONTINUED] fluorouracil (EFUDEX) 5 % cream as needed. Sun damage     Allergies:   Patient has no known allergies.   Social History   Socioeconomic History   Marital status: Married    Spouse name: Elta Guadeloupe   Number of children: 1   Years of education: high school   Highest education level: Not on file  Occupational History    Employer: unemployed  Tobacco Use   Smoking status: Every Day     Packs/day: 0.50    Years: 41.00    Total pack years: 20.50    Types: Cigarettes   Smokeless tobacco: Never   Tobacco comments:    Down to 2 cigs a day, trying to quit - for the last 5 years  Substance and Sexual Activity   Alcohol use: No   Drug use: No   Sexual activity: Not Currently    Birth control/protection: Post-menopausal  Other Topics Concern   Not on file  Social History Narrative   06/02/19   From: the area   Living: Husband, Elta Guadeloupe   Work: retired, was a Systems developer      Family: Son - Roderic Palau - 3 grandchildren who live nearby      Enjoys: yardwork, gardening      Exercise: workouts in the morning 10-30 minutes, has knee pain, walks 30 minutes for her dog   Diet: meat, veggies, a lot of sweets      Safety   Seat belts: Yes    Guns: Yes  and secure   Safe in relationships: Yes    Social Determinants of Health   Financial Resource Strain: Not on file  Food Insecurity: Not on file  Transportation Needs: Not on file  Physical  Activity: Not on file  Stress: Not on file  Social Connections: Not on file     Family History: The patient's family history includes Breast cancer (age of onset: 89) in her mother; Throat cancer in her mother. No heart disease in family.  ROS:   Please see the history of present illness.     All other systems reviewed and are negative.  EKGs/Labs/Other Studies Reviewed:    The following studies were reviewed today:  EKG:   02/24/21:  SR rate 73 anterior infarct pattern 01/04/21: SR rate 82 anterior infarct pattern  Cardiac Studies & Procedures          CT SCANS  CT CORONARY MORPH W/CTA COR W/SCORE 03/17/2021  Addendum 03/17/2021 12:13 PM ADDENDUM REPORT: 03/17/2021 12:11  HISTORY: Chest pain, nonspecific  EXAM: Cardiac/Coronary CT  TECHNIQUE: The patient was scanned on a [employer].  PROTOCOL: A 120 kV prospective scan was triggered in the descending thoracic aorta at 111 HU's. Axial non-contrast 3  mm slices were carried out through the heart. The data set was analyzed on a dedicated work station and scored using the [place]. Gantry rotation speed was 250 msecs and collimation was 0.6 mm. Heart rate optimized medically, and 0.8 mg of sublingual nitroglycerin was given. The 3D data set was reconstructed in 5% intervals of 35-75% of the R-R cycle. Diastolic phases were analyzed on a dedicated work station using MPR, MIP and VRT modes. The patient received 22m OMNIPAQUE IOHEXOL 350 MG/ML SOLN of contrast.  FINDINGS: Coronary calcium score: The patient's coronary artery calcium score is 30, which places the patient in the 68th percentile.  Coronary arteries: Normal coronary origins.  Co-dominance.  Right Coronary Artery: Normal caliber vessel, gives rise to right PDA. No significant plaque or stenosis.  Left Main Coronary Artery: Normal caliber vessel. No significant plaque or stenosis.  Left Anterior Descending Coronary Artery: Normal caliber vessel. There is a small, focal, calcified plaque in the mid LAD with 1-24% stenosis. Gives rise to large fist and normal second diagonal branches. Distal LAD wraps apex.  Left Circumflex Artery: Normal caliber vessel, gives rise to left PDA. No significant plaque or stenosis. Gives rise to 3 OM branches.  Aorta: Normal size, 27 mm at the mid ascending aorta (level of the PA bifurcation) measured double oblique. Scattered calcifications consistent with aortic atherosclerosis. No dissection seen in visualized portions of the aorta.  Aortic Valve: No calcifications. Trileaflet.  Other findings:  Normal pulmonary vein drainage into the left atrium.  Normal left atrial appendage without a thrombus.  Normal size of the pulmonary artery.  Normal appearance of the pericardium.  IMPRESSION: 1.  Minimal nonobstructive CAD, CADRADS = 1.  2. Coronary calcium score of 30. This was 68th percentile for age and sex matched  control.  3. Normal coronary origin with co-dominance.  INTERPRETATION:  1. CAD-RADS 0: No evidence of CAD (0%). Consider non-atherosclerotic causes of chest pain.  2. CAD-RADS 1: Minimal non-obstructive CAD (0-24%). Consider non-atherosclerotic causes of chest pain. Consider preventive therapy and risk factor modification.  3. CAD-RADS 2: Mild non-obstructive CAD (25-49%). Consider non-atherosclerotic causes of chest pain. Consider preventive therapy and risk factor modification.  4. CAD-RADS 3: Moderate stenosis (50-69%). Consider symptom-guided anti-ischemic pharmacotherapy as well as risk factor modification per guideline directed care. Additional analysis with CT FFR will be submitted.  5. CAD-RADS 4: Severe stenosis. (70-99% or > 50% left main). Cardiac catheterization or CT FFR is recommended. Consider symptom-guided anti-ischemic pharmacotherapy as  well as risk factor modification per guideline directed care. Invasive coronary angiography recommended with revascularization per published guideline statements.  6. CAD-RADS 5: Total coronary occlusion (100%). Consider cardiac catheterization or viability assessment. Consider symptom-guided anti-ischemic pharmacotherapy as well as risk factor modification per guideline directed care.  7. CAD-RADS N: Non-diagnostic study. Obstructive CAD can't be excluded. Alternative evaluation is recommended.   Electronically Signed By: Buford Dresser M.D. On: 03/17/2021 12:11  Narrative EXAM: OVER-READ INTERPRETATION  CT CHEST  The following report is an over-read performed by radiologist Dr. Vinnie Langton of [facility], [place] on 03/17/2021. This over-read does not include interpretation of cardiac or coronary anatomy or pathology. The coronary calcium score/coronary CTA interpretation by the cardiologist is attached.  COMPARISON:  None.  FINDINGS: Aortic atherosclerosis scratch the atherosclerotic  calcifications in the thoracic aorta. Within the visualized portions of the thorax there are no suspicious appearing pulmonary nodules or masses, there is no acute consolidative airspace disease, no pleural effusions, no pneumothorax and no lymphadenopathy. Visualized portions of the upper abdomen are unremarkable. There are no aggressive appearing lytic or blastic lesions noted in the visualized portions of the skeleton.  IMPRESSION: 1.  Aortic Atherosclerosis (ICD10-I70.0).  Electronically Signed: By: Vinnie Langton M.D. On: 03/17/2021 09:58           Recent Labs: 01/05/2022: ALT 12; BUN 16; Creatinine, Ser 0.84; Hemoglobin 13.4; Platelets 162.0; Potassium 4.3; Sodium 138  Recent Lipid Panel    Component Value Date/Time   CHOL 143 03/07/2022 0725   TRIG 75 03/07/2022 0725   HDL 65 03/07/2022 0725   CHOLHDL 2.2 03/07/2022 0725   CHOLHDL 3 01/04/2021 0917   VLDL 19.0 01/04/2021 0917   LDLCALC 63 03/07/2022 0725        Physical Exam:    VS:  BP 118/60   Pulse 70   Ht 5' 1"$  (1.549 m)   Wt 138 lb (62.6 kg)   SpO2 98%   BMI 26.07 kg/m     Wt Readings from Last 3 Encounters:  05/22/22 138 lb (62.6 kg)  01/05/22 138 lb (62.6 kg)  05/24/21 139 lb (63 kg)    Gen: no distress, well nourished  Neck: No JVD Cardiac: No Rubs or Gallops, systolic crescendo murmur, normal rate, +2 radial pulses Respiratory: Clear to auscultation bilaterally, normal effort, normal  respiratory rate GI: Soft, nontender, non-distended  MS: No edema;  moves all extremities Integument: Skin feels warm Neuro:  At time of evaluation, alert and oriented to person/place/time/situation Psych: Normal affect, patient feels normal   ASSESSMENT:    1. Heart murmur, systolic   2. Tobacco use disorder   3. Coronary artery disease involving native coronary artery of native heart without angina pectoris   4. Aortic atherosclerosis ([place])   5. Hyperlipidemia, unspecified hyperlipidemia type      PLAN:    Systolic heart murmur - will get echo  Minimal non obstructive CAD Aortic atherosclerosis HLD - ASA 81 mg PO daily  - rosuvastatin 10 mg  - LDL at goal - discussed strength based exercise with silver sneakers  Tobacco Abuse - greater than 20 pack year and active - has cut down needs to set quit date, discussed - getting lung CT screen (she is pending set up with new PCP_  One year me or APP       Medication Adjustments/Labs and Tests Ordered: Current medicines are reviewed at length with the patient today.  Concerns regarding medicines are outlined above.  Orders Placed  This Encounter  Procedures   CT CHEST LUNG CA SCREEN LOW DOSE W/O CM   EKG 12-Lead   ECHOCARDIOGRAM COMPLETE    Meds ordered this encounter  Medications   aspirin EC ([employer] ASPIRIN LOW DOSE) 81 MG tablet    Sig: Take 1 tablet (81 mg total) by mouth 5 (five) times daily. SWALLOW WHOLE.    Dispense:  90 tablet    Refill:  3     Patient Instructions  Medication Instructions:  Your physician recommends that you continue on your current medications as directed. Please refer to the Current Medication list given to you today.  *If you need a refill on your cardiac medications before your next appointment, please call your pharmacy*   Lab Work: NONE If you have labs (blood work) drawn today and your tests are completely normal, you will receive your results only by: [place] (if you have MyChart) OR A paper copy in the mail If you have any lab test that is abnormal or we need to change your treatment, we will call you to review the results.   Testing/Procedures: Your physician has requested that you have an echocardiogram. Echocardiography is a painless test that uses sound waves to create images of your heart. It provides your doctor with information about the size and shape of your heart and how well your heart's chambers and valves are working. This procedure takes approximately  one hour. There are no restrictions for this procedure. Please do NOT wear cologne, perfume, aftershave, or lotions (deodorant is allowed). Please arrive 15 minutes prior to your appointment time.  Your physician has requested that you have a CT of your lungs.    Follow-Up: At [facility], you and your health needs are our priority.  As part of our continuing mission to provide you with exceptional heart care, we have created designated Provider Care Teams.  These Care Teams include your primary Cardiologist (physician) and Advanced Practice Providers (APPs -  Physician Assistants and Nurse Practitioners) who all work together to provide you with the care you need, when you need it.  We recommend signing up for the patient portal called "MyChart".  Sign up information is provided on this After Visit Summary.  MyChart is used to connect with patients for Virtual Visits (Telemedicine).  Patients are able to view lab/test results, encounter notes, upcoming appointments, etc.  Non-urgent messages can be sent to your provider as well.   To learn more about what you can do with MyChart, go to [email or web address].    Your next appointment:   1 year(s)  Provider:   Werner Lean, MD       Signed, Werner Lean, MD  05/22/2022 10:44 AM    [place]

## 2022-06-07 DIAGNOSIS — Z01419 Encounter for gynecological examination (general) (routine) without abnormal findings: Secondary | ICD-10-CM | POA: Diagnosis not present

## 2022-06-08 ENCOUNTER — Ambulatory Visit (HOSPITAL_COMMUNITY)
Admission: RE | Admit: 2022-06-08 | Discharge: 2022-06-08 | Disposition: A | Discharge status: Home / Self Care | Payer: Medicare HMO | Source: Ambulatory Visit | Attending: Internal Medicine | Admitting: Internal Medicine

## 2022-06-08 DIAGNOSIS — F17210 Nicotine dependence, cigarettes, uncomplicated: Secondary | ICD-10-CM | POA: Insufficient documentation

## 2022-06-08 DIAGNOSIS — Z122 Encounter for screening for malignant neoplasm of respiratory organs: Secondary | ICD-10-CM | POA: Insufficient documentation

## 2022-06-08 DIAGNOSIS — I700 Atherosclerosis of aorta: Secondary | ICD-10-CM | POA: Diagnosis not present

## 2022-06-08 DIAGNOSIS — J439 Emphysema, unspecified: Secondary | ICD-10-CM | POA: Insufficient documentation

## 2022-06-08 DIAGNOSIS — F17200 Nicotine dependence, unspecified, uncomplicated: Secondary | ICD-10-CM

## 2022-06-12 ENCOUNTER — Telehealth: Payer: Self-pay | Admitting: Internal Medicine

## 2022-06-12 NOTE — Telephone Encounter (Signed)
Follow Up:   Patient is calling to see if her results are ready from the CT Chest she had on 06-08-22 please?

## 2022-06-12 NOTE — Telephone Encounter (Signed)
Called pt advised MD has not reviewed results.  Will send a message to MD.

## 2022-06-15 NOTE — Progress Notes (Signed)
noted 

## 2022-06-20 ENCOUNTER — Ambulatory Visit (HOSPITAL_COMMUNITY): Payer: Medicare HMO | Attending: Cardiovascular Disease

## 2022-06-20 DIAGNOSIS — R011 Cardiac murmur, unspecified: Secondary | ICD-10-CM | POA: Diagnosis not present

## 2022-06-20 LAB — ECHOCARDIOGRAM COMPLETE
Area-P 1/2: 2.87 cm2
S' Lateral: 2.2 cm

## 2022-06-21 NOTE — Progress Notes (Signed)
noted 

## 2022-07-06 ENCOUNTER — Encounter: Payer: Self-pay | Admitting: Family

## 2022-07-06 ENCOUNTER — Ambulatory Visit (INDEPENDENT_AMBULATORY_CARE_PROVIDER_SITE_OTHER): Payer: Medicare HMO | Admitting: Family

## 2022-07-06 VITALS — BP 118/64 | HR 73 | Temp 97.8°F | Ht 61.0 in | Wt 139.2 lb

## 2022-07-06 DIAGNOSIS — E785 Hyperlipidemia, unspecified: Secondary | ICD-10-CM | POA: Diagnosis not present

## 2022-07-06 DIAGNOSIS — R011 Cardiac murmur, unspecified: Secondary | ICD-10-CM | POA: Diagnosis not present

## 2022-07-06 DIAGNOSIS — F17200 Nicotine dependence, unspecified, uncomplicated: Secondary | ICD-10-CM

## 2022-07-06 DIAGNOSIS — I700 Atherosclerosis of aorta: Secondary | ICD-10-CM | POA: Diagnosis not present

## 2022-07-06 NOTE — Assessment & Plan Note (Signed)
Stable.  Continue statin and zetia

## 2022-07-06 NOTE — Assessment & Plan Note (Signed)
Stable  Continue f/u with cardiology as scheduled.

## 2022-07-06 NOTE — Assessment & Plan Note (Signed)
Smoking cessation instruction/counseling given:  counseled patient on the dangers of tobacco use, advised patient to stop smoking, and reviewed strategies to maximize success 

## 2022-07-06 NOTE — Progress Notes (Signed)
Established Patient Office Visit  Subjective:  Patient ID: Deanna Bell, female    DOB: Nov 18, 1955  Age: 67 y.o. MRN: [id number]  CC:  Chief Complaint  Patient presents with   Establish Care    HPI Deanna Bell is here for a transition of care visit.  Prior provider was: Dr. Waunita Schooner  Cardiologist: Dr. Gasper Sells, sees him annually    Pt is without acute concerns.   Going for annual CT low dose scan, for lung cancer screening.  Goes to dermatology annual.  Utd on annual eye exam  Utd on dental visits  No h/o STD's  Cologuard 01/11/22, polyp found, repeat 7 years   Last metabolic panel Lab Results  Component Value Date   GLUCOSE 84 01/05/2022   NA 138 01/05/2022   K 4.3 01/05/2022   CL 102 01/05/2022   CO2 31 01/05/2022   BUN 16 01/05/2022   CREATININE 0.84 01/05/2022   EGFR 79 02/24/2021   CALCIUM 9.3 01/05/2022   PROT 7.3 01/05/2022   ALBUMIN 4.4 01/05/2022   BILITOT 0.5 01/05/2022   ALKPHOS 54 01/05/2022   AST 21 01/05/2022   ALT 12 01/05/2022    chronic concerns:  HLD: on rosuvastatin 10 mg and zetia 10 mg once daily.,  Lab Results  Component Value Date   CHOL 143 03/07/2022   HDL 65 03/07/2022   LDLCALC 63 03/07/2022   TRIG 75 03/07/2022   CHOLHDL 2.2 03/07/2022   CAD and Aortic atherosclerosis managed, on asa 81 mg once daily.  Echocardiogram a few weeks ago for a new onset heart murmur.   Tobacco use: smokes only when she goes out to lunch with her girlfriends, socially.   Mammogram: 7/23 negative findings  Shingles: not interested  Bone density: > 2 y/o she declines for today       Past Medical History:  Diagnosis Date   Arterial stenosis (HCC)    Hyperlipidemia     Past Surgical History:  Procedure Laterality Date   NO PAST SURGERIES      Family History  Problem Relation Age of Onset   Breast cancer Mother 58   Throat cancer Mother    Lung cancer Mother        non smoker   Atrial fibrillation Father     Healthy Brother    Breast cancer Paternal Grandmother        26    Social History   Socioeconomic History   Marital status: Married    Spouse name: Doctor, general practice   Number of children: 1   Years of education: high school   Highest education level: Not on file  Occupational History   Occupation: retired  Tobacco Use   Smoking status: Every Day    Packs/day: 0.50    Years: 41.00    Additional pack years: 0.00    Total pack years: 20.50    Types: Cigarettes   Smokeless tobacco: Never   Tobacco comments:    Down to 2 cigs a day, trying to quit - for the last 5 years  Vaping Use   Vaping Use: Never used  Substance and Sexual Activity   Alcohol use: No   Drug use: No   Sexual activity: Yes    Partners: Male    Birth control/protection: Post-menopausal  Other Topics Concern   Not on file  Social History Narrative   06/02/19   From: the area   Living: Husband, Elta Guadeloupe   Work: retired, was a tobacco  farmer      Family: Son - Roderic Palau - 3 grandchildren who live nearby      Enjoys: yardwork, gardening      Exercise: workouts in the morning 10-30 minutes, has knee pain, walks 30 minutes for her dog   Diet: meat, veggies, a lot of sweets      Safety   Seat belts: Yes    Guns: Yes  and secure   Safe in relationships: Yes    Social Determinants of Health   Financial Resource Strain: Not on file  Food Insecurity: Not on file  Transportation Needs: Not on file  Physical Activity: Not on file  Stress: Not on file  Social Connections: Not on file  Intimate Partner Violence: Not on file    Outpatient Medications Prior to Visit  Medication Sig Dispense Refill   aspirin EC ([employer] ASPIRIN LOW DOSE) 81 MG tablet Take 1 tablet (81 mg total) by mouth 5 (five) times daily. SWALLOW WHOLE. 90 tablet 3   cholecalciferol (VITAMIN D) 25 MCG (1000 UNIT) tablet Take 1 tablet by mouth daily at 12 noon.     ezetimibe (ZETIA) 10 MG tablet Take 1 tablet (10 mg total) by mouth daily. 90 tablet 3    rosuvastatin (CRESTOR) 10 MG tablet Take 1 tablet (10 mg total) by mouth daily. 90 tablet 3   No facility-administered medications prior to visit.    No Known Allergies  ROS: Pertinent symptoms negative unless otherwise noted in HPI      Objective:    Physical Exam Vitals reviewed.  Constitutional:      Appearance: Normal appearance.  Eyes:     General:        Right eye: No discharge.        Left eye: No discharge.     Conjunctiva/sclera: Conjunctivae normal.  Cardiovascular:     Rate and Rhythm: Normal rate and regular rhythm.  Pulmonary:     Effort: Pulmonary effort is normal. No respiratory distress.     Breath sounds: Normal breath sounds.  Musculoskeletal:        General: Normal range of motion.     Cervical back: Normal range of motion.  Neurological:     General: No focal deficit present.     Mental Status: She is alert and oriented to person, place, and time. Mental status is at baseline.  Psychiatric:        Mood and Affect: Mood normal.        Behavior: Behavior normal.        Thought Content: Thought content normal.        Judgment: Judgment normal.      BP 118/64   Pulse 73   Temp 97.8 F (36.6 C) (Temporal)   Ht 5\' 1"  (1.549 m)   Wt 139 lb 3.2 oz (63.1 kg)   SpO2 98%   BMI 26.30 kg/m  Wt Readings from Last 3 Encounters:  07/06/22 139 lb 3.2 oz (63.1 kg)  05/22/22 138 lb (62.6 kg)  01/05/22 138 lb (62.6 kg)     There are no preventive care reminders to display for this patient.  There are no preventive care reminders to display for this patient.  Lab Results  Component Value Date   TSH 1.08 12/12/2017   Lab Results  Component Value Date   WBC 5.5 01/05/2022   HGB 13.4 01/05/2022   HCT 39.9 01/05/2022   MCV 93.0 01/05/2022   PLT 162.0 01/05/2022   Lab Results  Component Value Date   NA 138 01/05/2022   K 4.3 01/05/2022   CO2 31 01/05/2022   GLUCOSE 84 01/05/2022   BUN 16 01/05/2022   CREATININE 0.84 01/05/2022   BILITOT  0.5 01/05/2022   ALKPHOS 54 01/05/2022   AST 21 01/05/2022   ALT 12 01/05/2022   PROT 7.3 01/05/2022   ALBUMIN 4.4 01/05/2022   CALCIUM 9.3 01/05/2022   EGFR 79 02/24/2021   GFR 72.27 01/05/2022   Lab Results  Component Value Date   CHOL 143 03/07/2022   Lab Results  Component Value Date   HDL 65 03/07/2022   Lab Results  Component Value Date   LDLCALC 63 03/07/2022   Lab Results  Component Value Date   TRIG 75 03/07/2022   Lab Results  Component Value Date   CHOLHDL 2.2 03/07/2022   No results found for: "HGBA1C"    Assessment & Plan:   Aortic atherosclerosis (HCC) Assessment & Plan: Continue baby asa  Will continue to monitor lipid panel as necessary   Tobacco use disorder Assessment & Plan: Smoking cessation instruction/counseling given:  counseled patient on the dangers of tobacco use, advised patient to stop smoking, and reviewed strategies to maximize success    Hyperlipidemia, unspecified hyperlipidemia type Assessment & Plan: Stable.  Continue statin and zetia    Heart murmur, systolic Assessment & Plan: Stable  Continue f/u with cardiology as scheduled.     No orders of the defined types were placed in this encounter.   Follow-up: Return in about 1 year (around 07/06/2023) for f/u CPE.    Eugenia Pancoast, FNP

## 2022-07-06 NOTE — Assessment & Plan Note (Signed)
Continue baby asa  Will continue to monitor lipid panel as necessary

## 2022-07-15 ENCOUNTER — Other Ambulatory Visit: Payer: Self-pay | Admitting: Internal Medicine

## 2022-07-18 NOTE — Telephone Encounter (Signed)
Called pt reports only taking ASA 81 mg PO QD not 5 times daily.  New script sent into pharmacy.

## 2022-08-01 DIAGNOSIS — L570 Actinic keratosis: Secondary | ICD-10-CM | POA: Diagnosis not present

## 2022-08-01 DIAGNOSIS — L814 Other melanin hyperpigmentation: Secondary | ICD-10-CM | POA: Diagnosis not present

## 2022-08-01 DIAGNOSIS — L720 Epidermal cyst: Secondary | ICD-10-CM | POA: Diagnosis not present

## 2022-08-01 DIAGNOSIS — Z85828 Personal history of other malignant neoplasm of skin: Secondary | ICD-10-CM | POA: Diagnosis not present

## 2022-08-01 DIAGNOSIS — L821 Other seborrheic keratosis: Secondary | ICD-10-CM | POA: Diagnosis not present

## 2022-08-01 DIAGNOSIS — D485 Neoplasm of uncertain behavior of skin: Secondary | ICD-10-CM | POA: Diagnosis not present

## 2022-08-01 DIAGNOSIS — D225 Melanocytic nevi of trunk: Secondary | ICD-10-CM | POA: Diagnosis not present

## 2022-08-01 DIAGNOSIS — L565 Disseminated superficial actinic porokeratosis (DSAP): Secondary | ICD-10-CM | POA: Diagnosis not present

## 2022-08-14 ENCOUNTER — Other Ambulatory Visit: Payer: Self-pay | Admitting: Internal Medicine

## 2022-08-15 ENCOUNTER — Other Ambulatory Visit: Payer: Self-pay

## 2022-08-15 NOTE — Telephone Encounter (Signed)
Pt called stating that

## 2022-11-07 DIAGNOSIS — Z1231 Encounter for screening mammogram for malignant neoplasm of breast: Secondary | ICD-10-CM | POA: Diagnosis not present

## 2022-11-07 LAB — HM MAMMOGRAPHY

## 2022-11-18 ENCOUNTER — Other Ambulatory Visit: Payer: Self-pay | Admitting: Internal Medicine

## 2022-12-20 ENCOUNTER — Ambulatory Visit (INDEPENDENT_AMBULATORY_CARE_PROVIDER_SITE_OTHER): Payer: Medicare HMO

## 2022-12-20 DIAGNOSIS — Z0000 Encounter for general adult medical examination without abnormal findings: Secondary | ICD-10-CM

## 2022-12-20 NOTE — Patient Instructions (Addendum)
Ms. Schmidtke , Thank you for taking time to come for your Medicare Wellness Visit. I appreciate your ongoing commitment to your health goals. Please review the following plan we discussed and let me know if I can assist you in the future.   Referrals/Orders/Follow-Ups/Clinician Recommendations: none  This is a list of the screening recommended for you and due dates:  Health Maintenance  Topic Date Due   Zoster (Shingles) Vaccine (1 of 2) Never done   Mammogram  10/27/2022   Flu Shot  11/09/2022   COVID-19 Vaccine (3 - 2023-24 season) 12/10/2022   DEXA scan (bone density measurement)  01/06/2023*   Screening for Lung Cancer  06/08/2023   Medicare Annual Wellness Visit  12/20/2023   Colon Cancer Screening  03/14/2029   DTaP/Tdap/Td vaccine (3 - Td or Tdap) 12/31/2029   Pneumonia Vaccine  Completed   Hepatitis C Screening  Completed   HPV Vaccine  Aged Out   Cologuard (Stool DNA test)  Discontinued  *Topic was postponed. The date shown is not the original due date.    Advanced directives: (In Chart) A copy of your advanced directives are scanned into your chart should your provider ever need it.  Next Medicare Annual Wellness Visit scheduled for next year:   Insert Preventive Care attachment Insert FALL PREVENTION attachment if needed

## 2022-12-20 NOTE — Progress Notes (Signed)
Subjective:   Deanna Bell is a 67 y.o. female who presents for Medicare Annual (Subsequent) preventive examination.  Visit Complete: Virtual  I connected with  Deanna Bell on 12/20/22 by a audio enabled telemedicine application and verified that I am speaking with the correct person using two identifiers.  Patient Location: Home  Provider Location: Office/Clinic  I discussed the limitations of evaluation and management by telemedicine. The patient expressed understanding and agreed to proceed.  Vital Signs: Unable to obtain new vitals due to this being a telehealth visit.  Review of Systems     Cardiac Risk Factors include: advanced age (>63men, >36 women);dyslipidemia     Objective:    Today's Vitals   There is no height or weight on file to calculate BMI.     12/20/2022    4:31 PM 01/05/2022    8:19 AM 01/04/2021    8:44 AM  Advanced Directives  Does Patient Have a Medical Advance Directive? Yes No;Yes No  Type of Estate agent of [place];Living will Living will   Does patient want to make changes to medical advance directive?  No - Patient declined Yes (MAU/Ambulatory/Procedural Areas - Information given)  Copy of Healthcare Power of Attorney in Chart? Yes - validated most recent copy scanned in chart (See row information)    Would patient like information on creating a medical advance directive?  Yes (MAU/Ambulatory/Procedural Areas - Information given)     Current Medications (verified) Outpatient Encounter Medications as of 12/20/2022  Medication Sig   aspirin EC (BAYER LOW DOSE) 81 MG tablet TAKE 1 TABLET (81 MG TOTAL) BY MOUTH DAILY. SWALLOW WHOLE.   cholecalciferol (VITAMIN D) 25 MCG (1000 UNIT) tablet Take 1 tablet by mouth daily at 12 noon.   ezetimibe (ZETIA) 10 MG tablet TAKE 1 TABLET BY MOUTH EVERY DAY   rosuvastatin (CRESTOR) 10 MG tablet TAKE 1 TABLET BY MOUTH EVERY DAY   No facility-administered encounter medications on file  as of 12/20/2022.    Allergies (verified) Patient has no known allergies.   History: Past Medical History:  Diagnosis Date   Arterial stenosis (HCC)    Hyperlipidemia    Past Surgical History:  Procedure Laterality Date   NO PAST SURGERIES     Family History  Problem Relation Age of Onset   Breast cancer Mother 15   Throat cancer Mother    Lung cancer Mother        non smoker   Atrial fibrillation Father    Healthy Brother    Breast cancer Paternal Grandmother        10   Social History   Socioeconomic History   Marital status: Married    Spouse name: Merchant navy officer   Number of children: 1   Years of education: high school   Highest education level: Not on file  Occupational History   Occupation: retired  Tobacco Use   Smoking status: Every Day    Current packs/day: 0.50    Average packs/day: 0.5 packs/day for 41.0 years (20.5 ttl pk-yrs)    Types: Cigarettes   Smokeless tobacco: Never   Tobacco comments:    Down to 2 cigs a day, trying to quit - for the last 5 years  Vaping Use   Vaping status: Never Used  Substance and Sexual Activity   Alcohol use: No   Drug use: No   Sexual activity: Yes    Partners: Male    Birth control/protection: Post-menopausal  Other Topics Concern  Not on file  Social History Narrative   06/02/19   From: the area   Living: Husband, Deanna Bell   Work: retired, was a Land      Family: Son - Deanna Bell - 3 grandchildren who live nearby      Enjoys: yardwork, gardening      Exercise: workouts in the morning 10-30 minutes, has knee pain, walks 30 minutes for her dog   Diet: meat, veggies, a lot of sweets      Safety   Seat belts: Yes    Guns: Yes  and secure   Safe in relationships: Yes    Social Determinants of Health   Financial Resource Strain: Low Risk  (12/20/2022)   Overall Financial Resource Strain (CARDIA)    Difficulty of Paying Living Expenses: Not hard at all  Food Insecurity: No Food Insecurity (12/20/2022)    Hunger Vital Sign    Worried About Running Out of Food in the Last Year: Never true    Ran Out of Food in the Last Year: Never true  Transportation Needs: No Transportation Needs (12/20/2022)   PRAPARE - Administrator, Civil Service (Medical): No    Lack of Transportation (Non-Medical): No  Physical Activity: Sufficiently Active (12/20/2022)   Exercise Vital Sign    Days of Exercise per Week: 7 days    Minutes of Exercise per Session: 30 min  Stress: No Stress Concern Present (12/20/2022)   [employer] of Occupational Health - Occupational Stress Questionnaire    Feeling of Stress : Not at all  Social Connections: Moderately Isolated (12/20/2022)   Social Connection and Isolation Panel [NHANES]    Frequency of Communication with Friends and Family: More than three times a week    Frequency of Social Gatherings with Friends and Family: Twice a week    Attends Religious Services: Never    Database administrator or Organizations: No    Attends Engineer, structural: Never    Marital Status: Married    Tobacco Counseling Ready to quit: No Counseling given: Not Answered Tobacco comments: Down to 2 cigs a day, trying to quit - for the last 5 years   Clinical Intake:  Pre-visit preparation completed: Yes  Pain : No/denies pain     Nutritional Risks: None Diabetes: No  How often do you need to have someone help you when you read instructions, pamphlets, or other written materials from your doctor or pharmacy?: 1 - Never  Interpreter Needed?: No  Information entered by :: NAllen LPN   Activities of Daily Living    12/20/2022    4:27 PM 01/05/2022    8:21 AM  In your present state of health, do you have any difficulty performing the following activities:  Hearing? 0   Vision? 0   Difficulty concentrating or making decisions? 0   Walking or climbing stairs? 0   Dressing or bathing? 0   Doing errands, shopping? 0   Preparing Food and eating ? N N   Using the Toilet? N N  In the past six months, have you accidently leaked urine? N N  Do you have problems with loss of bowel control? N N  Managing your Medications? N N  Managing your Finances? N N  Housekeeping or managing your Housekeeping? N N    Patient Care Team: Mort Sawyers, FNP as PCP - General (Family Medicine) Christell Constant, MD as PCP - Cardiology (Cardiology) Huel Cote, MD as Consulting Physician (Obstetrics  and Gynecology) Bufford Buttner, MD as Referring Physician (Dermatology)  Indicate any recent Medical Services you may have received from other than [facility] providers in the past year (date may be approximate).     Assessment:   This is a routine wellness examination for [place].  Hearing/Vision screen Hearing Screening - Comments:: Denies hearing issues Vision Screening - Comments:: Regular eye exams, [facility]   Goals Addressed             This Visit's Progress    Patient Stated       12/20/2022, denies goals at this time       Depression Screen    12/20/2022    4:32 PM 07/06/2022    8:25 AM 01/05/2022    8:10 AM 01/04/2021    8:27 AM 01/01/2020   11:43 AM 12/24/2018   10:06 AM 12/12/2017    8:58 AM  PHQ 2/9 Scores  PHQ - 2 Score 0 0 0 0 0 0 0  PHQ- 9 Score 0  0        Fall Risk    12/20/2022    4:31 PM 07/06/2022    8:24 AM 01/05/2022    8:10 AM 01/04/2021    8:27 AM 12/12/2017    8:58 AM  Fall Risk   Falls in the past year? 0 0 0 0 No  Number falls in past yr: 0 0 0 0   Injury with Fall? 0 0 0    Risk for fall due to : Medication side effect      Follow up Falls prevention discussed;Falls evaluation completed Falls evaluation completed;Education provided;Falls prevention discussed       MEDICARE RISK AT HOME: Medicare Risk at Home Any stairs in or around the home?: Yes If so, are there any without handrails?: No Home free of loose throw rugs in walkways, pet beds, electrical cords, etc?: Yes Adequate lighting in your home  to reduce risk of falls?: Yes Life alert?: No Use of a cane, walker or w/c?: No Grab bars in the bathroom?: No Shower chair or bench in shower?: Yes Elevated toilet seat or a handicapped toilet?: No  TIMED UP AND GO:  Was the test performed?  No    Cognitive Function:        12/20/2022    4:32 PM  6CIT Screen  What Year? 0 points  What month? 0 points  What time? 0 points  Count back from 20 0 points  Months in reverse 0 points  Repeat phrase 0 points  Total Score 0 points    Immunizations Immunization History  Administered Date(s) Administered   Fluad Quad(high Dose 65+) 01/04/2021, 01/05/2022   Influenza Whole 01/05/2010   Influenza,inj,Quad PF,6+ Mos 12/12/2013, 01/01/2015, 12/24/2018, 01/01/2020   PFIZER(Purple Top)SARS-COV-2 Vaccination 06/22/2019, 07/14/2019   PNEUMOCOCCAL CONJUGATE-20 01/05/2022   Pneumococcal Polysaccharide-23 01/01/2020   Td 01/05/2010   Tdap 01/01/2020    TDAP status: Up to date  Flu Vaccine status: Due, Education has been provided regarding the importance of this vaccine. Advised may receive this vaccine at local pharmacy or Health Dept. Aware to provide a copy of the vaccination record if obtained from local pharmacy or Health Dept. Verbalized acceptance and understanding.  Pneumococcal vaccine status: Up to date  Covid-19 vaccine status: Information provided on how to obtain vaccines.   Qualifies for Shingles Vaccine? Yes   Zostavax completed No   Shingrix Completed?: No.    Education has been provided regarding the importance of this vaccine. Patient  has been advised to call insurance company to determine out of pocket expense if they have not yet received this vaccine. Advised may also receive vaccine at local pharmacy or Health Dept. Verbalized acceptance and understanding.  Screening Tests Health Maintenance  Topic Date Due   Zoster Vaccines- Shingrix (1 of 2) Never done   MAMMOGRAM  10/27/2022   INFLUENZA VACCINE  11/09/2022    COVID-19 Vaccine (3 - 2023-24 season) 12/10/2022   DEXA SCAN  01/06/2023 (Originally 04/27/2020)   Lung Cancer Screening  06/08/2023   Medicare Annual Wellness (AWV)  12/20/2023   Colonoscopy  03/14/2029   DTaP/Tdap/Td (3 - Td or Tdap) 12/31/2029   Pneumonia Vaccine 87+ Years old  Completed   Hepatitis C Screening  Completed   HPV VACCINES  Aged Out   Fecal DNA (Cologuard)  Discontinued    Health Maintenance  Health Maintenance Due  Topic Date Due   Zoster Vaccines- Shingrix (1 of 2) Never done   MAMMOGRAM  10/27/2022   INFLUENZA VACCINE  11/09/2022   COVID-19 Vaccine (3 - 2023-24 season) 12/10/2022    Colorectal cancer screening: Type of screening: Colonoscopy. Completed 03/14/2022. Repeat every 7 years  Mammogram status: Completed 10/2022. Repeat every year  Bone Density status: declines  Lung Cancer Screening: (Low Dose CT Chest recommended if Age 34-80 years, 20 pack-year currently smoking OR have quit w/in 15years.) does qualify.   Lung Cancer Screening Referral: 06/07/2022  Additional Screening:  Hepatitis C Screening: does qualify; Completed 12/24/2018  Vision Screening: Recommended annual ophthalmology exams for early detection of glaucoma and other disorders of the eye. Is the patient up to date with their annual eye exam?  Yes  Who is the provider or what is the name of the office in which the patient attends annual eye exams? [facility] If pt is not established with a provider, would they like to be referred to a provider to establish care? No .   Dental Screening: Recommended annual dental exams for proper oral hygiene  Diabetic Foot Exam: n/a  Community Resource Referral / Chronic Care Management: CRR required this visit?  No   CCM required this visit?  No     Plan:     I have personally reviewed and noted the following in the patient's chart:   Medical and social history Use of alcohol, tobacco or illicit drugs  Current medications and supplements  including opioid prescriptions. Patient is not currently taking opioid prescriptions. Functional ability and status Nutritional status Physical activity Advanced directives List of other physicians Hospitalizations, surgeries, and ER visits in previous 12 months Vitals Screenings to include cognitive, depression, and falls Referrals and appointments  In addition, I have reviewed and discussed with patient certain preventive protocols, quality metrics, and best practice recommendations. A written personalized care plan for preventive services as well as general preventive health recommendations were provided to patient.     Barb Merino, LPN   11/02/3662   After Visit Summary: (MyChart) Due to this being a telephonic visit, the after visit summary with patients personalized plan was offered to patient via MyChart   Nurse Notes: none

## 2023-01-09 ENCOUNTER — Ambulatory Visit: Payer: Medicare HMO | Admitting: Family

## 2023-01-09 ENCOUNTER — Encounter: Payer: Self-pay | Admitting: Family

## 2023-01-09 VITALS — BP 118/74 | HR 78 | Temp 97.7°F | Ht 61.0 in | Wt 133.6 lb

## 2023-01-09 DIAGNOSIS — E785 Hyperlipidemia, unspecified: Secondary | ICD-10-CM

## 2023-01-09 DIAGNOSIS — Z79899 Other long term (current) drug therapy: Secondary | ICD-10-CM | POA: Diagnosis not present

## 2023-01-09 DIAGNOSIS — Z23 Encounter for immunization: Secondary | ICD-10-CM

## 2023-01-09 DIAGNOSIS — Z0000 Encounter for general adult medical examination without abnormal findings: Secondary | ICD-10-CM | POA: Diagnosis not present

## 2023-01-09 DIAGNOSIS — F17200 Nicotine dependence, unspecified, uncomplicated: Secondary | ICD-10-CM | POA: Diagnosis not present

## 2023-01-09 DIAGNOSIS — I700 Atherosclerosis of aorta: Secondary | ICD-10-CM

## 2023-01-09 DIAGNOSIS — J432 Centrilobular emphysema: Secondary | ICD-10-CM | POA: Insufficient documentation

## 2023-01-09 LAB — COMPREHENSIVE METABOLIC PANEL
ALT: 12 U/L (ref 0–35)
AST: 20 U/L (ref 0–37)
Albumin: 4.3 g/dL (ref 3.5–5.2)
Alkaline Phosphatase: 54 U/L (ref 39–117)
BUN: 13 mg/dL (ref 6–23)
CO2: 31 meq/L (ref 19–32)
Calcium: 9.3 mg/dL (ref 8.4–10.5)
Chloride: 102 meq/L (ref 96–112)
Creatinine, Ser: 0.88 mg/dL (ref 0.40–1.20)
GFR: 67.87 mL/min (ref >60.00)
Glucose, Bld: 91 mg/dL (ref 70–99)
Potassium: 3.9 meq/L (ref 3.5–5.1)
Sodium: 140 meq/L (ref 135–145)
Total Bilirubin: 0.7 mg/dL (ref 0.2–1.2)
Total Protein: 7.1 g/dL (ref 6.0–8.3)

## 2023-01-09 LAB — LIPID PANEL
Cholesterol: 131 mg/dL (ref 0–200)
HDL: 62.9 mg/dL (ref >39.00)
LDL Cholesterol: 53 mg/dL (ref 0–99)
NonHDL: 68.31
Total CHOL/HDL Ratio: 2
Triglycerides: 78 mg/dL (ref 0.0–149.0)
VLDL: 15.6 mg/dL (ref 0.0–40.0)

## 2023-01-09 NOTE — Assessment & Plan Note (Signed)
Advised smoking cessation  Continue rosuvastatin and zetia as prescribed.

## 2023-01-09 NOTE — Assessment & Plan Note (Signed)
Patient Counseling(The following topics were reviewed):  Preventative care handout given to pt  Health maintenance and immunizations reviewed. Please refer to Health maintenance section. Pt advised on safe sex, wearing seatbelts in car, and proper nutrition labwork ordered today for annual Dental health: Discussed importance of regular tooth brushing, flossing, and dental visits. Flu shot in office today

## 2023-01-09 NOTE — Assessment & Plan Note (Signed)
Ordered lipid panel, pending results. Work on low cholesterol diet and exercise as tolerated Continue rosuvastatin 10 mg and zetia 10 mg once daily.

## 2023-01-09 NOTE — Assessment & Plan Note (Signed)
Stable  controlled

## 2023-01-09 NOTE — Assessment & Plan Note (Signed)
Smoking cessation instruction/counseling given:  counseled patient on the dangers of tobacco use, advised patient to stop smoking, and reviewed strategies to maximize success 

## 2023-01-09 NOTE — Progress Notes (Signed)
Subjective:  Patient ID: Deanna Bell, female    DOB: Aug 25, 1955  Age: 67 y.o. MRN: [id number]  Patient Care Team: Mort Sawyers, FNP as PCP - General (Family Medicine) Christell Constant, MD as PCP - Cardiology (Cardiology) Huel Cote, MD as Consulting Physician (Obstetrics and Gynecology) Bufford Buttner, MD as Referring Physician (Dermatology)   CC:  Chief Complaint  Patient presents with   Annual Exam    HPI Deanna Bell is a 67 y.o. female who presents today for an annual physical exam. She reports consuming a general diet.  Walking at least 30 min a day  She generally feels well. She reports sleeping well. She does not have additional problems to discuss today.   Vision:Within last year Dental:Receives regular dental care  Tobacco abuse: only smokes when she goes out to lunch with her friends 1-2 times a week.during that time she will smoke one cigarette. Has a h/o 41 pack year smoking. Had CT for lung cancer screening, 06/08/22 with aortic atherosclerosis as well as mild COPD, mild centrilobular and paraseptal emphysema.  Mammogram: [facility], 11/07/22 normal  Last pap: > 65 y/o  Colonoscopy: 03/14/22 Q7Y as cologuard positive 01/11/22 Bone density scan: > 5 years ?  Flu vaccine, will get today.   Pt is without acute concerns.   Advanced Directives Patient does have advanced directives  mMRC dyspnea scale, asked in office, on a scale 0-4  0 I only get breathless with strenuous exercise  Have you had 2 or more moderate exacerbations or at least 1 hospitalization for COPD exacerbation in the past year? No   Does the patient have high peripheral eosinophil levels (>300 ): unknown, will order today.   DEPRESSION SCREENING    12/20/2022    4:32 PM 07/06/2022    8:25 AM 01/05/2022    8:10 AM 01/04/2021    8:27 AM 01/01/2020   11:43 AM 12/24/2018   10:06 AM 12/12/2017    8:58 AM  PHQ 2/9 Scores  PHQ - 2 Score 0 0 0 0 0 0 0  PHQ- 9 Score 0  0          ROS: Negative unless specifically indicated above in HPI.    Current Outpatient Medications:    aspirin EC (BAYER LOW DOSE) 81 MG tablet, TAKE 1 TABLET (81 MG TOTAL) BY MOUTH DAILY. SWALLOW WHOLE., Disp: 30 tablet, Rfl: 6   cholecalciferol (VITAMIN D) 25 MCG (1000 UNIT) tablet, Take 1 tablet by mouth daily at 12 noon., Disp: , Rfl:    ezetimibe (ZETIA) 10 MG tablet, TAKE 1 TABLET BY MOUTH EVERY DAY, Disp: 90 tablet, Rfl: 1   rosuvastatin (CRESTOR) 10 MG tablet, TAKE 1 TABLET BY MOUTH EVERY DAY, Disp: 90 tablet, Rfl: 1    Objective:    BP 118/74 (BP Location: Left Arm, Patient Position: Sitting, Cuff Size: Normal)   Pulse 78   Temp 97.7 F (36.5 C) (Temporal)   Ht 5\' 1"  (1.549 m)   Wt 133 lb 9.6 oz (60.6 kg)   SpO2 97%   BMI 25.24 kg/m   BP Readings from Last 3 Encounters:  01/09/23 118/74  07/06/22 118/64  05/22/22 118/60      Physical Exam Constitutional:      General: She is not in acute distress.    Appearance: Normal appearance. She is normal weight. She is not ill-appearing.  HENT:     Head: Normocephalic.     Right Ear: Tympanic membrane normal.  Left Ear: Tympanic membrane normal.     Nose: Nose normal.     Mouth/Throat:     Mouth: Mucous membranes are moist.  Eyes:     Extraocular Movements: Extraocular movements intact.     Pupils: Pupils are equal, round, and reactive to light.  Cardiovascular:     Rate and Rhythm: Normal rate and regular rhythm.  Pulmonary:     Effort: Pulmonary effort is normal.     Breath sounds: Normal breath sounds.  Abdominal:     General: Abdomen is flat. Bowel sounds are normal.     Palpations: Abdomen is soft.     Tenderness: There is no guarding or rebound.  Musculoskeletal:        General: Normal range of motion.     Cervical back: Normal range of motion.  Skin:    General: Skin is warm.     Capillary Refill: Capillary refill takes less than 2 seconds.  Neurological:     General: No focal deficit present.      Mental Status: She is alert.  Psychiatric:        Mood and Affect: Mood normal.        Behavior: Behavior normal.        Thought Content: Thought content normal.        Judgment: Judgment normal.          Assessment & Plan:  Centrilobular emphysema (HCC) Assessment & Plan: Stable controlled.   Tobacco use disorder Assessment & Plan: Smoking cessation instruction/counseling given:  counseled patient on the dangers of tobacco use, advised patient to stop smoking, and reviewed strategies to maximize success    Hyperlipidemia, unspecified hyperlipidemia type Assessment & Plan: Ordered lipid panel, pending results. Work on low cholesterol diet and exercise as tolerated Continue rosuvastatin 10 mg and zetia 10 mg once daily.  Orders: -     Lipid panel -     Lipid panel; Future  Aortic atherosclerosis (HCC) Assessment & Plan: Advised smoking cessation  Continue rosuvastatin and zetia as prescribed.   Encounter for general adult medical examination without abnormal findings Assessment & Plan: Patient Counseling(The following topics were reviewed):  Preventative care handout given to pt  Health maintenance and immunizations reviewed. Please refer to Health maintenance section. Pt advised on safe sex, wearing seatbelts in car, and proper nutrition labwork ordered today for annual Dental health: Discussed importance of regular tooth brushing, flossing, and dental visits. Flu shot in office today    On statin therapy -     Comprehensive metabolic panel      Follow-up: Return in about 1 year (around 01/09/2024) for f/u CPE.   Mort Sawyers, FNP

## 2023-02-06 DIAGNOSIS — D225 Melanocytic nevi of trunk: Secondary | ICD-10-CM | POA: Diagnosis not present

## 2023-02-06 DIAGNOSIS — L565 Disseminated superficial actinic porokeratosis (DSAP): Secondary | ICD-10-CM | POA: Diagnosis not present

## 2023-02-06 DIAGNOSIS — C44729 Squamous cell carcinoma of skin of left lower limb, including hip: Secondary | ICD-10-CM | POA: Diagnosis not present

## 2023-02-06 DIAGNOSIS — L821 Other seborrheic keratosis: Secondary | ICD-10-CM | POA: Diagnosis not present

## 2023-02-06 DIAGNOSIS — Z85828 Personal history of other malignant neoplasm of skin: Secondary | ICD-10-CM | POA: Diagnosis not present

## 2023-02-06 DIAGNOSIS — L814 Other melanin hyperpigmentation: Secondary | ICD-10-CM | POA: Diagnosis not present

## 2023-02-06 DIAGNOSIS — D485 Neoplasm of uncertain behavior of skin: Secondary | ICD-10-CM | POA: Diagnosis not present

## 2023-02-06 DIAGNOSIS — L720 Epidermal cyst: Secondary | ICD-10-CM | POA: Diagnosis not present

## 2023-02-06 DIAGNOSIS — L570 Actinic keratosis: Secondary | ICD-10-CM | POA: Diagnosis not present

## 2023-02-12 ENCOUNTER — Encounter: Payer: Self-pay | Admitting: Family

## 2023-02-12 DIAGNOSIS — Z8589 Personal history of malignant neoplasm of other organs and systems: Secondary | ICD-10-CM | POA: Insufficient documentation

## 2023-02-12 NOTE — Progress Notes (Signed)
noted 

## 2023-05-13 ENCOUNTER — Other Ambulatory Visit: Payer: Self-pay | Admitting: Internal Medicine

## 2023-05-16 ENCOUNTER — Other Ambulatory Visit: Payer: Self-pay | Admitting: Internal Medicine

## 2023-05-18 ENCOUNTER — Ambulatory Visit: Payer: Medicare Other | Attending: Internal Medicine | Admitting: Internal Medicine

## 2023-05-18 ENCOUNTER — Encounter: Payer: Self-pay | Admitting: Internal Medicine

## 2023-05-18 VITALS — BP 112/50 | HR 77 | Ht 61.0 in | Wt 131.0 lb

## 2023-05-18 DIAGNOSIS — I700 Atherosclerosis of aorta: Secondary | ICD-10-CM

## 2023-05-18 DIAGNOSIS — I2510 Atherosclerotic heart disease of native coronary artery without angina pectoris: Secondary | ICD-10-CM

## 2023-05-18 DIAGNOSIS — E785 Hyperlipidemia, unspecified: Secondary | ICD-10-CM

## 2023-05-18 DIAGNOSIS — R011 Cardiac murmur, unspecified: Secondary | ICD-10-CM

## 2023-05-18 DIAGNOSIS — F17200 Nicotine dependence, unspecified, uncomplicated: Secondary | ICD-10-CM

## 2023-05-18 MED ORDER — NITROGLYCERIN 0.4 MG SL SUBL
0.4000 mg | SUBLINGUAL_TABLET | SUBLINGUAL | 3 refills | Status: AC | PRN
Start: 2023-05-18 — End: ?

## 2023-05-18 NOTE — Progress Notes (Signed)
 Cardiology Office Note:    Date:  05/18/2023   ID:  Deanna Bell, DOB [birth year], MRN [id number]  PCP:  Corwin Antu, FNP   [facility] Providers Cardiologist:  Stanly DELENA Leavens, MD     Referring MD: Corwin Antu, FNP   CC: CP follow Up  History of Present Illness:    Deanna Bell is a 68 y.o. female with a hx of HLD and tobacco abuse.  Seen in 2022 with minimal non obstructive CAD after CCTA. 2023: Working on smoking cessation 2024: Quit date considered.  Deanna Bell presents for one year f/u. She experiences chest discomfort, particularly during strenuous activities like cutting wood. The sensation is described as 'pressure' and is sometimes accompanied by a feeling of weakness. She manages these episodes by sitting down, taking deep breaths, and resting until the discomfort subsides. No shortness of breath or syncope is reported.  She has a history of minimal nonobstructive coronary artery disease, first identified in 2022. Previous diagnostic workups include an echocardiogram showing minimal calcification on her aortic valve without evidence of aortic stenosis, and a lung CT scan which was negative. Her cholesterol levels are well-controlled, with her hyperlipidemia managed effectively on her current medications.  She has a history of smoking and has discussed setting a quit date. She acknowledges that her smoking is influenced by social interactions with friends.  Past Medical History:  Diagnosis Date   Arterial stenosis (HCC)    Hyperlipidemia     Past Surgical History:  Procedure Laterality Date   NO PAST SURGERIES      Current Medications: Current Meds  Medication Sig   aspirin  EC (BAYER LOW DOSE) 81 MG tablet TAKE 1 TABLET (81 MG TOTAL) BY MOUTH DAILY. SWALLOW WHOLE.   cholecalciferol (VITAMIN D) 25 MCG (1000 UNIT) tablet Take 1 tablet by mouth daily at 12 noon.   ezetimibe  (ZETIA ) 10 MG tablet Take 1 tablet (10 mg total) by mouth daily. Please  keep scheduled appointment for future refills. Thank you.   rosuvastatin  (CRESTOR ) 10 MG tablet Take 1 tablet (10 mg total) by mouth daily. Please keep scheduled appointment for future refills. Thank you.     Allergies:   Patient has no known allergies.   Social History   Socioeconomic History   Marital status: Married    Spouse name: Oneil   Number of children: 1   Years of education: high school   Highest education level: Not on file  Occupational History   Occupation: retired  Tobacco Use   Smoking status: Every Day    Current packs/day: 0.50    Average packs/day: 0.5 packs/day for 41.0 years (20.5 ttl pk-yrs)    Types: Cigarettes   Smokeless tobacco: Never   Tobacco comments:    Down to 2 cigs a day, trying to quit - for the last 5 years  Vaping Use   Vaping status: Never Used  Substance and Sexual Activity   Alcohol use: No   Drug use: No   Sexual activity: Yes    Partners: Male    Birth control/protection: Post-menopausal  Other Topics Concern   Not on file  Social History Narrative   06/02/19   From: the area   Living: Husband, Oneil   Work: retired, was a land      Family: Son - Dorn - 3 grandchildren who live nearby      Enjoys: yardwork, gardening      Exercise: workouts in the morning 10-30 minutes, has knee  pain, walks 30 minutes for her dog   Diet: meat, veggies, a lot of sweets      Safety   Seat belts: Yes    Guns: Yes  and secure   Safe in relationships: Yes    Social Drivers of Health   Financial Resource Strain: Low Risk  (12/20/2022)   Overall Financial Resource Strain (CARDIA)    Difficulty of Paying Living Expenses: Not hard at all  Food Insecurity: No Food Insecurity (12/20/2022)   Hunger Vital Sign    Worried About Running Out of Food in the Last Year: Never true    Ran Out of Food in the Last Year: Never true  Transportation Needs: No Transportation Needs (12/20/2022)   PRAPARE - Administrator, Civil Service  (Medical): No    Lack of Transportation (Non-Medical): No  Physical Activity: Sufficiently Active (12/20/2022)   Exercise Vital Sign    Days of Exercise per Week: 7 days    Minutes of Exercise per Session: 30 min  Stress: No Stress Concern Present (12/20/2022)   [employer] of Occupational Health - Occupational Stress Questionnaire    Feeling of Stress : Not at all  Social Connections: Moderately Isolated (12/20/2022)   Social Connection and Isolation Panel [NHANES]    Frequency of Communication with Friends and Family: More than three times a week    Frequency of Social Gatherings with Friends and Family: Twice a week    Attends Religious Services: Never    Database Administrator or Organizations: No    Attends Engineer, Structural: Never    Marital Status: Married     Family History: The patient's family history includes Atrial fibrillation in her father; Breast cancer in her paternal grandmother; Breast cancer (age of onset: 2) in her mother; Healthy in her brother; Lung cancer in her mother; Throat cancer in her mother. No heart disease in family.  ROS:   Please see the history of present illness.     EKGs/Labs/Other Studies Reviewed:    The following studies were reviewed today:  EKG: Small septal infarct pattern (05/18/2023); Stable   Cardiac Studies & Procedures      ECHOCARDIOGRAM  ECHOCARDIOGRAM COMPLETE 06/20/2022  Narrative ECHOCARDIOGRAM REPORT    Patient Name:   Deanna Bell Date of Exam: 06/20/2022 Medical Rec #:  [id number]       Height:       61.0 in Accession #:    [id number]      Weight:       138.0 lb Date of Birth:  25-Nov-1955       BSA:          1.613 m Patient Age:    67 years        BP:           118/60 mmHg Patient Gender: F               HR:           66 bpm. Exam Location:  [address]  Procedure: 2D Echo, Cardiac Doppler and Color Doppler  Indications:    R01.1 Murmur  History:        Patient has no prior history of  Echocardiogram examinations. CAD, Signs/Symptoms:Murmur; Risk Factors:Dyslipidemia and Current Smoker.  Sonographer:    Elsie Bohr RDCS Referring Phys: [id number] [facility] A Azelyn Batie  IMPRESSIONS   1. Left ventricular ejection fraction, by estimation, is 60 to 65%. The left ventricle has normal function.  The left ventricle has no regional wall motion abnormalities. There is mild left ventricular hypertrophy of the septal segment. Left ventricular diastolic parameters were normal. 2. Right ventricular systolic function is normal. The right ventricular size is normal. 3. The mitral valve is normal in structure. No evidence of mitral valve regurgitation. No evidence of mitral stenosis. 4. The aortic valve is tricuspid. There is mild calcification of the aortic valve. There is mild thickening of the aortic valve. Aortic valve regurgitation is trivial. No aortic stenosis is present. 5. The inferior vena cava is normal in size with <50% respiratory variability, suggesting right atrial pressure of 8 mmHg.  FINDINGS Left Ventricle: Left ventricular ejection fraction, by estimation, is 60 to 65%. The left ventricle has normal function. The left ventricle has no regional wall motion abnormalities. The left ventricular internal cavity size was normal in size. There is mild left ventricular hypertrophy of the septal segment. Left ventricular diastolic parameters were normal. Normal left ventricular filling pressure.  Right Ventricle: The right ventricular size is normal. No increase in right ventricular wall thickness. Right ventricular systolic function is normal.  Left Atrium: Left atrial size was normal in size.  Right Atrium: Right atrial size was normal in size.  Pericardium: There is no evidence of pericardial effusion.  Mitral Valve: The mitral valve is normal in structure. No evidence of mitral valve regurgitation. No evidence of mitral valve stenosis.  Tricuspid Valve: The tricuspid  valve is normal in structure. Tricuspid valve regurgitation is not demonstrated. No evidence of tricuspid stenosis.  Aortic Valve: The aortic valve is tricuspid. There is mild calcification of the aortic valve. There is mild thickening of the aortic valve. Aortic valve regurgitation is trivial. No aortic stenosis is present.  Pulmonic Valve: The pulmonic valve was normal in structure. Pulmonic valve regurgitation is not visualized. No evidence of pulmonic stenosis.  Aorta: The aortic root is normal in size and structure.  Venous: The inferior vena cava is normal in size with less than 50% respiratory variability, suggesting right atrial pressure of 8 mmHg.  IAS/Shunts: No atrial level shunt detected by color flow Doppler.   LEFT VENTRICLE PLAX 2D LVIDd:         3.90 cm   Diastology LVIDs:         2.20 cm   LV e' medial:    9.90 cm/s LV PW:         1.00 cm   LV E/e' medial:  7.4 LV IVS:        1.10 cm   LV e' lateral:   12.00 cm/s LVOT diam:     1.80 cm   LV E/e' lateral: 6.1 LV SV:         49 LV SV Index:   30 LVOT Area:     2.54 cm   RIGHT VENTRICLE             IVC RV S prime:     11.40 cm/s  IVC diam: 1.40 cm TAPSE (M-mode): 1.9 cm  LEFT ATRIUM             Index        RIGHT ATRIUM           Index LA diam:        3.30 cm 2.05 cm/m   RA Pressure: 3.00 mmHg LA Vol (A2C):   36.0 ml 22.32 ml/m  RA Area:     14.80 cm LA Vol (A4C):   32.7 ml 20.27 ml/m  RA Volume:   41.40 ml  25.66 ml/m LA Biplane Vol: 35.5 ml 22.01 ml/m AORTIC VALVE LVOT Vmax:   87.00 cm/s LVOT Vmean:  58.000 cm/s LVOT VTI:    0.191 m  AORTA Ao Root diam: 3.10 cm Ao Asc diam:  2.90 cm  MITRAL VALVE               TRICUSPID VALVE MV Area (PHT): 2.87 cm    Estimated RAP:  3.00 mmHg MV Decel Time: 264 msec MV E velocity: 72.80 cm/s  SHUNTS MV A velocity: 67.60 cm/s  Systemic VTI:  0.19 m MV E/A ratio:  1.08        Systemic Diam: 1.80 cm  Annabella Scarce MD Electronically signed by Annabella Scarce  MD Signature Date/Time: 06/20/2022/4:16:02 PM    Final    CT SCANS  CT CORONARY MORPH W/CTA COR W/SCORE 03/17/2021  Addendum 03/17/2021 12:13 PM ADDENDUM REPORT: 03/17/2021 12:11  HISTORY: Chest pain, nonspecific  EXAM: Cardiac/Coronary CT  TECHNIQUE: The patient was scanned on a [employer] scanner.  PROTOCOL: A 120 kV prospective scan was triggered in the descending thoracic aorta at 111 HU's. Axial non-contrast 3 mm slices were carried out through the heart. The data set was analyzed on a dedicated work station and scored using the Agatson method. Gantry rotation speed was 250 msecs and collimation was 0.6 mm. Heart rate optimized medically, and 0.8 mg of sublingual nitroglycerin  was given. The 3D data set was reconstructed in 5% intervals of 35-75% of the R-R cycle. Diastolic phases were analyzed on a dedicated work station using MPR, MIP and VRT modes. The patient received 95mL OMNIPAQUE  IOHEXOL  350 MG/ML SOLN of contrast.  FINDINGS: Coronary calcium  score: The patient's coronary artery calcium  score is 30, which places the patient in the 68th percentile.  Coronary arteries: Normal coronary origins.  Co-dominance.  Right Coronary Artery: Normal caliber vessel, gives rise to right PDA. No significant plaque or stenosis.  Left Main Coronary Artery: Normal caliber vessel. No significant plaque or stenosis.  Left Anterior Descending Coronary Artery: Normal caliber vessel. There is a small, focal, calcified plaque in the mid LAD with 1-24% stenosis. Gives rise to large fist and normal second diagonal branches. Distal LAD wraps apex.  Left Circumflex Artery: Normal caliber vessel, gives rise to left PDA. No significant plaque or stenosis. Gives rise to 3 OM branches.  Aorta: Normal size, 27 mm at the mid ascending aorta (level of the PA bifurcation) measured double oblique. Scattered calcifications consistent with aortic atherosclerosis. No dissection seen  in visualized portions of the aorta.  Aortic Valve: No calcifications. Trileaflet.  Other findings:  Normal pulmonary vein drainage into the left atrium.  Normal left atrial appendage without a thrombus.  Normal size of the pulmonary artery.  Normal appearance of the pericardium.  IMPRESSION: 1.  Minimal nonobstructive CAD, CADRADS = 1.  2. Coronary calcium  score of 30. This was 68th percentile for age and sex matched control.  3. Normal coronary origin with co-dominance.  INTERPRETATION:  1. CAD-RADS 0: No evidence of CAD (0%). Consider non-atherosclerotic causes of chest pain.  2. CAD-RADS 1: Minimal non-obstructive CAD (0-24%). Consider non-atherosclerotic causes of chest pain. Consider preventive therapy and risk factor modification.  3. CAD-RADS 2: Mild non-obstructive CAD (25-49%). Consider non-atherosclerotic causes of chest pain. Consider preventive therapy and risk factor modification.  4. CAD-RADS 3: Moderate stenosis (50-69%). Consider symptom-guided anti-ischemic pharmacotherapy as well as risk factor modification per guideline directed care. Additional analysis with CT  FFR will be submitted.  5. CAD-RADS 4: Severe stenosis. (70-99% or > 50% left main). Cardiac catheterization or CT FFR is recommended. Consider symptom-guided anti-ischemic pharmacotherapy as well as risk factor modification per guideline directed care. Invasive coronary angiography recommended with revascularization per published guideline statements.  6. CAD-RADS 5: Total coronary occlusion (100%). Consider cardiac catheterization or viability assessment. Consider symptom-guided anti-ischemic pharmacotherapy as well as risk factor modification per guideline directed care.  7. CAD-RADS N: Non-diagnostic study. Obstructive CAD can't be excluded. Alternative evaluation is recommended.   Electronically Signed By: Shelda Bruckner M.D. On: 03/17/2021  12:11  Narrative EXAM: OVER-READ INTERPRETATION  CT CHEST  The following report is an over-read performed by radiologist Dr. Toribio Aye of [facility] on 03/17/2021. This over-read does not include interpretation of cardiac or coronary anatomy or pathology. The coronary calcium  score/coronary CTA interpretation by the cardiologist is attached.  COMPARISON:  None.  FINDINGS: Aortic atherosclerosis scratch the atherosclerotic calcifications in the thoracic aorta. Within the visualized portions of the thorax there are no suspicious appearing pulmonary nodules or masses, there is no acute consolidative airspace disease, no pleural effusions, no pneumothorax and no lymphadenopathy. Visualized portions of the upper abdomen are unremarkable. There are no aggressive appearing lytic or blastic lesions noted in the visualized portions of the skeleton.  IMPRESSION: 1.  Aortic Atherosclerosis (ICD10-I70.0).  Electronically Signed: By: Toribio Aye M.D. On: 03/17/2021 09:58           Recent Labs: 01/09/2023: ALT 12; BUN 13; Creatinine, Ser 0.88; Potassium 3.9; Sodium 140  Recent Lipid Panel    Component Value Date/Time   CHOL 131 01/09/2023 0834   CHOL 143 03/07/2022 0725   TRIG 78.0 01/09/2023 0834   HDL 62.90 01/09/2023 0834   HDL 65 03/07/2022 0725   CHOLHDL 2 01/09/2023 0834   VLDL 15.6 01/09/2023 0834   LDLCALC 53 01/09/2023 0834   LDLCALC 63 03/07/2022 0725        Physical Exam:    VS:  BP (!) 112/50 (BP Location: Right Arm)   Pulse 77   Ht 5' 1 (1.549 m)   Wt 131 lb (59.4 kg)   SpO2 98%   BMI 24.75 kg/m     Wt Readings from Last 3 Encounters:  05/18/23 131 lb (59.4 kg)  01/09/23 133 lb 9.6 oz (60.6 kg)  07/06/22 139 lb 3.2 oz (63.1 kg)    Gen: no distress, well nourished  Neck: No JVD Cardiac: No rubs or gallops, systolic crescendo murmur, normal rate, +2 radial pulses Respiratory: Clear to auscultation bilaterally, normal effort,  normal  respiratory rate GI: Soft, nontender, non-distended  MS: No edema;  moves all extremities Integument: Skin feels warm Neuro:  At time of evaluation, alert and oriented to person/place/time/situation Psych: Normal affect, patient feels normal   ASSESSMENT:    1. Coronary artery disease involving native coronary artery of native heart without angina pectoris   2. Heart murmur, systolic   3. Aortic atherosclerosis (HCC)   4. Hyperlipidemia, unspecified hyperlipidemia type     PLAN:     Coronary Artery Disease (CAD) Minimal nonobstructive CAD with intermittent chest discomfort during strenuous activities. EKG shows a small septal infarct pattern consistent since 2022. Previous echocardiogram showed minimal calcification without aortic stenosis. Discussed potential coronary microvascular disease and smoking cessation. Informed about nitroglycerin  use and PET scan if symptoms worsen or nitroglycerin  is ineffective. - Prescribe nitroglycerin  for chest pain - Perform PET scan if chest pain worsens or nitroglycerin   is ineffective - Encourage smoking cessation - continue current medications  Aortic Valve Sclerosis Aortic valve sclerosis with minimal calcification on echocardiogram. No aortic stenosis. Asymptomatic with no current need for further screening unless new symptoms develop. Discussed potential future aortic stenosis and valve replacement. - Monitor for new symptoms  Hyperlipidemia Cholesterol levels well-controlled with current medications, levels under 55. - Continue current lipid-lowering therapy  General Health Maintenance Transitioning to a new primary care provider. Lung CT negative, recommended annual screening. Discussed importance of regular follow-ups and lifestyle modifications. - Annual lung CT for lung cancer screening as per PCP  Follow-up - Follow-up in one year unless new symptoms develop - Contact if experiencing increased chest discomfort or  ineffective nitroglycerin  - Contact if experiencing new or worsening shortness of breath.       Medication Adjustments/Labs and Tests Ordered: Current medicines are reviewed at length with the patient today.  Concerns regarding medicines are outlined above.  Orders Placed This Encounter  Procedures   EKG 12-Lead    No orders of the defined types were placed in this encounter.    There are no Patient Instructions on file for this visit.   Signed, Stanly DELENA Leavens, MD  05/18/2023 9:43 AM    [facility]

## 2023-05-18 NOTE — Patient Instructions (Signed)
 Medication Instructions:  Your physician has recommended you make the following change in your medication:  START: Nitroglycerin  0.4 mg by mouth 1 every 5 minutes up to 15 minutes as needed for chest pain.  If you have chest pain place 1 tablet under your tongue, wait 5 min If chest pain continues place a 2nd tablet under your tongue, wait 5 min If chest pain continues place a 3rd tablet under your tongue, wait 5 min If chest pain continues call 911   *If you need a refill on your cardiac medications before your next appointment, please call your pharmacy*   Lab Work: NONE  If you have labs (blood work) drawn today and your tests are completely normal, you will receive your results only by: MyChart Message (if you have MyChart) OR A paper copy in the mail If you have any lab test that is abnormal or we need to change your treatment, we will call you to review the results.   Testing/Procedures: NONE   Follow-Up: At [facility], you and your health needs are our priority.  As part of our continuing mission to provide you with exceptional heart care, we have created designated Provider Care Teams.  These Care Teams include your primary Cardiologist (physician) and Advanced Practice Providers (APPs -  Physician Assistants and Nurse Practitioners) who all work together to provide you with the care you need, when you need it.   Your next appointment:   1 year(s)  Provider:   Stanly DELENA Leavens, MD

## 2023-06-05 ENCOUNTER — Telehealth: Payer: Self-pay | Admitting: Internal Medicine

## 2023-06-05 MED ORDER — ROSUVASTATIN CALCIUM 10 MG PO TABS: 10.0000 mg | ORAL_TABLET | Freq: Every day | ORAL | 3 refills | Status: AC

## 2023-06-05 MED ORDER — EZETIMIBE 10 MG PO TABS: 10.0000 mg | ORAL_TABLET | Freq: Every day | ORAL | 3 refills | Status: AC

## 2023-06-05 NOTE — Telephone Encounter (Signed)
*  STAT* If patient is at the pharmacy, call can be transferred to refill team.   1. Which medications need to be refilled? (please list name of each medication and dose if known)   ezetimibe (ZETIA) 10 MG tablet  rosuvastatin (CRESTOR) 10 MG tablet   2. Would you like to learn more about the convenience, safety, & potential cost savings by using the [facility]?   3. Are you open to using the [facility] (Type [facility]. ).  4. Which pharmacy/location (including street and city if local pharmacy) is medication to be sent to?  [id number] - [place], [place] - [address]   5. Do they need a 30 day or 90 day supply?   90 day  Patient stated she still has some medication.

## 2023-07-09 ENCOUNTER — Encounter: Payer: Self-pay | Admitting: Family

## 2023-07-09 ENCOUNTER — Other Ambulatory Visit (INDEPENDENT_AMBULATORY_CARE_PROVIDER_SITE_OTHER)

## 2023-07-09 DIAGNOSIS — E785 Hyperlipidemia, unspecified: Secondary | ICD-10-CM

## 2023-07-09 LAB — LIPID PANEL
Cholesterol: 145 mg/dL (ref 0–200)
HDL: 59.9 mg/dL (ref >39.00)
LDL Cholesterol: 67 mg/dL (ref 0–99)
NonHDL: 85.05
Total CHOL/HDL Ratio: 2
Triglycerides: 88 mg/dL (ref 0.0–149.0)
VLDL: 17.6 mg/dL (ref 0.0–40.0)

## 2023-07-10 ENCOUNTER — Other Ambulatory Visit: Payer: Medicare HMO

## 2023-08-22 ENCOUNTER — Ambulatory Visit: Payer: Self-pay

## 2023-08-22 NOTE — Telephone Encounter (Signed)
 Copied from CRM [id number]. Topic: Clinical - Red Word Triage >> Aug 22, 2023  8:10 AM [place] E wrote: [facility] that prompted transfer to Nurse Triage: Pain in right shoulder/arm. Patient stated the pain has been going on for the past month. Moving the pain is an 8 out of 10, resting the pain is a 5 out of 10.  Chief Complaint: right arm and shoulder pain Symptoms: pain Frequency: constant Pertinent Negatives: Patient denies cp, sob, fever, swelling Disposition: [] ED /[] Urgent Care (no appt availability in office) / [x] Appointment(In office/virtual)/ []  [place] [facility]/ [] Home Care/ [] Refused Recommended Disposition /[] [place] [facility]/ []  Follow-up with PCP Additional Notes: per protocol apt made for tomorrow; care advice given, denies questions; instructed to go to ER if becomes worse.   Reason for Disposition  [1] Arm pains with exertion (e.g., walking) AND [2] pain goes away on resting AND [3] not present now  Answer Assessment - Initial Assessment Questions 1. ONSET: "When did the pain start?"     About a month ago 2. LOCATION: "Where is the pain located?"     Right arm and shoulder;  3. PAIN: "How bad is the pain?" (Scale 1-10; or mild, moderate, severe)   - MILD (1-3): Doesn't interfere with normal activities.   - MODERATE (4-7): Interferes with normal activities (e.g., work or school) or awakens from sleep.   - SEVERE (8-10): Excruciating pain, unable to do any normal activities, unable to hold a cup of water.     States when she moves it, 8/10, not moving it 5/10 4. WORK OR EXERCISE: "Has there been any recent work or exercise that involved this part of the body?"     denies 5. CAUSE: "What do you think is causing the arm pain?"     Maybe pulled muscle 6. OTHER SYMPTOMS: "Do you have any other symptoms?" (e.g., neck pain, swelling, rash, fever, numbness, weakness)     no 7. PREGNANCY: "Is there any chance you are pregnant?" "When was your last menstrual  period?"     no  Protocols used: Arm Pain-A-AH

## 2023-08-22 NOTE — Telephone Encounter (Signed)
 NOTED

## 2023-08-23 ENCOUNTER — Encounter: Payer: Self-pay | Admitting: Family Medicine

## 2023-08-23 ENCOUNTER — Ambulatory Visit (INDEPENDENT_AMBULATORY_CARE_PROVIDER_SITE_OTHER): Admitting: Family Medicine

## 2023-08-23 VITALS — BP 110/80 | HR 72 | Temp 98.1°F | Ht 61.0 in | Wt 130.0 lb

## 2023-08-23 DIAGNOSIS — M25511 Pain in right shoulder: Secondary | ICD-10-CM | POA: Insufficient documentation

## 2023-08-23 MED ORDER — PREDNISONE 20 MG PO TABS: ORAL_TABLET | ORAL | 0 refills | Status: DC

## 2023-08-23 NOTE — Assessment & Plan Note (Signed)
 Acute, no known injury or fall. No red flags or indication for imaging at this time. Most likely rotator cuff tendinitis.  Will start heat, range of motion and strengthening exercises as well as prednisone taper.  If pain not improving in the next 2 to 4 weeks she will return for shoulder x-ray and possible referral to physical therapy or referral  for steroid injection.  No evidence of new shortness of breath or decreased air movement and right upper lung lobe.  Reviewed past lung cancer screening CT.  Doubt Pancoast tumor. Return and ER precautions provided.

## 2023-08-23 NOTE — Progress Notes (Signed)
 Patient ID: Deanna Bell, female    DOB: 1955/08/05, 68 y.o.   MRN: [id number]  This visit was conducted in person.  BP 110/80 (BP Location: Left Arm, Patient Position: Sitting, Cuff Size: Normal)   Pulse 72   Temp 98.1 F (36.7 C) (Oral)   Ht 5\' 1"  (1.549 m)   Wt 130 lb (59 kg)   SpO2 98%   BMI 24.56 kg/m    CC:  Chief Complaint  Patient presents with   Arm Pain    Right arm and shoulder pain x 1 month. Patient states she thought she pulled a muscle but the pain is lasting more then usual.     Subjective:   HPI: Deanna Bell is a 68 y.o. female presenting on 08/23/2023 for Arm Pain (Right arm and shoulder pain x 1 month. Patient states she thought she pulled a muscle but the pain is lasting more then usual. )  New onset right arm and shoulder pain x 1 month... no change in activity or fall.  Gradual onset Pain intermittently 8/10 pain, associates with raising arm above head and pushing hand forward washing a window.  Using tylenol for pain off and on.  No neck pain,  no numbness, no weakness, no grip issues.     No history of osteoporosis  Smoker Reviewed past lung cancer screening CT  Relevant past medical, surgical, family and social history reviewed and updated as indicated. Interim medical history since our last visit reviewed. Allergies and medications reviewed and updated. Outpatient Medications Prior to Visit  Medication Sig Dispense Refill   aspirin  EC (BAYER LOW DOSE) 81 MG tablet TAKE 1 TABLET (81 MG TOTAL) BY MOUTH DAILY. SWALLOW WHOLE. 30 tablet 6   cholecalciferol (VITAMIN D) 25 MCG (1000 UNIT) tablet Take 1 tablet by mouth daily at 12 noon.     ezetimibe  (ZETIA ) 10 MG tablet Take 1 tablet (10 mg total) by mouth daily. Please keep scheduled appointment for future refills. Thank you. 90 tablet 3   nitroGLYCERIN  (NITROSTAT ) 0.4 MG SL tablet Place 1 tablet (0.4 mg total) under the tongue every 5 (five) minutes as needed. May take up to 3 tablets in 15 min  25 tablet 3   rosuvastatin  (CRESTOR ) 10 MG tablet Take 1 tablet (10 mg total) by mouth daily. Please keep scheduled appointment for future refills. Thank you. 90 tablet 3   No facility-administered medications prior to visit.     Per HPI unless specifically indicated in ROS section below Review of Systems  Constitutional:  Negative for fatigue and fever.  HENT:  Negative for ear pain.   Eyes:  Negative for pain.  Respiratory:  Negative for chest tightness and shortness of breath.   Cardiovascular:  Negative for chest pain, palpitations and leg swelling.  Gastrointestinal:  Negative for abdominal pain.  Genitourinary:  Negative for dysuria.  Musculoskeletal:  Negative for back pain, neck pain and neck stiffness.   Objective:  BP 110/80 (BP Location: Left Arm, Patient Position: Sitting, Cuff Size: Normal)   Pulse 72   Temp 98.1 F (36.7 C) (Oral)   Ht 5\' 1"  (1.549 m)   Wt 130 lb (59 kg)   SpO2 98%   BMI 24.56 kg/m   Wt Readings from Last 3 Encounters:  08/23/23 130 lb (59 kg)  05/18/23 131 lb (59.4 kg)  01/09/23 133 lb 9.6 oz (60.6 kg)      Physical Exam Constitutional:      General: She is  not in acute distress.    Appearance: Normal appearance. She is well-developed. She is not ill-appearing or toxic-appearing.  HENT:     Head: Normocephalic.     Right Ear: Hearing, tympanic membrane, ear canal and external ear normal. Tympanic membrane is not erythematous, retracted or bulging.     Left Ear: Hearing, tympanic membrane, ear canal and external ear normal. Tympanic membrane is not erythematous, retracted or bulging.     Nose: No mucosal edema or rhinorrhea.     Right Sinus: No maxillary sinus tenderness or frontal sinus tenderness.     Left Sinus: No maxillary sinus tenderness or frontal sinus tenderness.     Mouth/Throat:     Pharynx: Uvula midline.  Eyes:     General: Lids are normal. Lids are everted, no foreign bodies appreciated.     Conjunctiva/sclera: Conjunctivae  normal.     Pupils: Pupils are equal, round, and reactive to light.  Neck:     Thyroid : No thyroid  mass or thyromegaly.     Vascular: No carotid bruit.     Trachea: Trachea normal.  Cardiovascular:     Rate and Rhythm: Normal rate and regular rhythm.     Pulses: Normal pulses.     Heart sounds: Normal heart sounds, S1 normal and S2 normal. No murmur heard.    No friction rub. No gallop.  Pulmonary:     Effort: Pulmonary effort is normal. No tachypnea or respiratory distress.     Breath sounds: Normal breath sounds. No decreased breath sounds, wheezing, rhonchi or rales.  Abdominal:     General: Bowel sounds are normal.     Palpations: Abdomen is soft.     Tenderness: There is no abdominal tenderness.  Musculoskeletal:     Right shoulder: Tenderness and bony tenderness present. Decreased range of motion. Normal strength. Normal pulse.       Arms:     Cervical back: Normal, normal range of motion and neck supple. No spasms, tenderness or bony tenderness. No pain with movement. Normal range of motion.     Comments: Decreased ROM..  Pain with internal and external rotation.  Negative drop arm test.  Unable to abduct above 90 degrees without pain Tender to palpation over her anterior subacromial space as well as in right upper lateral posterior rib cage With some movements pain in right biceps medially, no tenderness to palpation over bicipital tendon Positive Neer's test Negative Spurling bilaterally  Skin:    General: Skin is warm and dry.     Findings: No rash.  Neurological:     Mental Status: She is alert.  Psychiatric:        Mood and Affect: Mood is not anxious or depressed.        Speech: Speech normal.        Behavior: Behavior normal. Behavior is cooperative.        Thought Content: Thought content normal.        Judgment: Judgment normal.       Results for orders placed or performed in visit on 07/09/23  Lipid panel   Collection Time: 07/09/23  7:39 AM  Result Value  Ref Range   Cholesterol 145 0 - 200 mg/dL   Triglycerides 24.4 0.0 - 149.0 mg/dL   HDL 01.02 >72.53 mg/dL   VLDL 66.4 0.0 - 40.3 mg/dL   LDL Cholesterol 67 0 - 99 mg/dL   Total CHOL/HDL Ratio 2    NonHDL 85.05     Assessment and  Plan  Acute pain of right shoulder Assessment & Plan: Acute, no known injury or fall. No red flags or indication for imaging at this time. Most likely rotator cuff tendinitis.  Will start heat, range of motion and strengthening exercises as well as prednisone taper.  If pain not improving in the next 2 to 4 weeks she will return for shoulder x-ray and possible referral to physical therapy or referral  for steroid injection.  No evidence of new shortness of breath or decreased air movement and right upper lung lobe.  Reviewed past lung cancer screening CT.  Doubt [place] tumor. Return and ER precautions provided.   Other orders -     predniSONE; 3 tabs by mouth daily x 3 days, then 2 tabs by mouth daily x 2 days then 1 tab by mouth daily x 2 days  Dispense: 15 tablet; Refill: 0    Return in about 4 weeks (around 09/20/2023), or if symptoms worsen or fail to improve after 2-4 weeks.   Herby Lolling, MD

## 2023-08-28 DIAGNOSIS — L565 Disseminated superficial actinic porokeratosis (DSAP): Secondary | ICD-10-CM | POA: Diagnosis not present

## 2023-08-28 DIAGNOSIS — L814 Other melanin hyperpigmentation: Secondary | ICD-10-CM | POA: Diagnosis not present

## 2023-08-28 DIAGNOSIS — L570 Actinic keratosis: Secondary | ICD-10-CM | POA: Diagnosis not present

## 2023-08-28 DIAGNOSIS — D692 Other nonthrombocytopenic purpura: Secondary | ICD-10-CM | POA: Diagnosis not present

## 2023-08-28 DIAGNOSIS — Z85828 Personal history of other malignant neoplasm of skin: Secondary | ICD-10-CM | POA: Diagnosis not present

## 2023-11-13 DIAGNOSIS — Z1231 Encounter for screening mammogram for malignant neoplasm of breast: Secondary | ICD-10-CM | POA: Diagnosis not present

## 2023-11-13 LAB — HM MAMMOGRAPHY

## 2023-11-14 ENCOUNTER — Ambulatory Visit: Payer: Self-pay | Admitting: Family

## 2023-11-14 NOTE — Progress Notes (Signed)
 noted

## 2024-01-01 ENCOUNTER — Ambulatory Visit (INDEPENDENT_AMBULATORY_CARE_PROVIDER_SITE_OTHER): Payer: Medicare HMO

## 2024-01-01 VITALS — Ht 61.0 in | Wt 130.0 lb

## 2024-01-01 DIAGNOSIS — Z0000 Encounter for general adult medical examination without abnormal findings: Secondary | ICD-10-CM

## 2024-01-01 NOTE — Patient Instructions (Signed)
 Deanna Bell,  Thank you for taking the time for your Medicare Wellness Visit. I appreciate your continued commitment to your health goals. Please review the care plan we discussed, and feel free to reach out if I can assist you further.  Medicare recommends these wellness visits once per year to help you and your care team stay ahead of potential health issues. These visits are designed to focus on prevention, allowing your provider to concentrate on managing your acute and chronic conditions during your regular appointments.  Please note that Annual Wellness Visits do not include a physical exam. Some assessments may be limited, especially if the visit was conducted virtually. If needed, we may recommend a separate in-person follow-up with your provider.  Ongoing Care Seeing your primary care provider every 3 to 6 months helps us  monitor your health and provide consistent, personalized care.   Referrals If a referral was made during today's visit and you haven't received any updates within two weeks, please contact the referred provider directly to check on the status.  Recommended Screenings:  Health Maintenance  Topic Date Due   Zoster (Shingles) Vaccine (1 of 2) Never done   Screening for Lung Cancer  06/08/2023   Flu Shot  11/09/2023   COVID-19 Vaccine (4 - 2025-26 season) 12/10/2023   DEXA scan (bone density measurement)  01/09/2024*   Medicare Annual Wellness Visit  12/31/2024   Breast Cancer Screening  11/12/2025   Colon Cancer Screening  03/14/2029   DTaP/Tdap/Td vaccine (3 - Td or Tdap) 12/31/2029   Pneumococcal Vaccine for age over 21  Completed   Hepatitis C Screening  Completed   HPV Vaccine  Aged Out   Meningitis B Vaccine  Aged Out   Cologuard (Stool DNA test)  Discontinued  *Topic was postponed. The date shown is not the original due date.       12/20/2022    4:31 PM  Advanced Directives  Does Patient Have a Medical Advance Directive? Yes  Type of Sports coach of [place];Living will  Copy of Healthcare Power of Attorney in Chart? Yes - validated most recent copy scanned in chart (See row information)   Advance Care Planning is important because it: Ensures you receive medical care that aligns with your values, goals, and preferences. Provides guidance to your family and loved ones, reducing the emotional burden of decision-making during critical moments.  Vision: Annual vision screenings are recommended for early detection of glaucoma, cataracts, and diabetic retinopathy. These exams can also reveal signs of chronic conditions such as diabetes and high blood pressure.  Dental: Annual dental screenings help detect early signs of oral cancer, gum disease, and other conditions linked to overall health, including heart disease and diabetes.  Please see the attached documents for additional preventive care recommendations.

## 2024-01-01 NOTE — Progress Notes (Signed)
 Subjective:   Deanna Bell is a 68 y.o. who presents for a Medicare Wellness preventive visit.  As a reminder, Annual Wellness Visits don't include a physical exam, and some assessments may be limited, especially if this visit is performed virtually. We may recommend an in-person follow-up visit with your provider if needed.  Visit Complete: Virtual I connected with  Adrien JAYSON Fischer on 01/01/24 by a audio enabled telemedicine application and verified that I am speaking with the correct person using two identifiers.  Patient Location: Home  Provider Location: Office/Clinic  I discussed the limitations of evaluation and management by telemedicine. The patient expressed understanding and agreed to proceed.  Vital Signs: Because this visit was a virtual/telehealth visit, some criteria may be missing or patient reported. Any vitals not documented were not able to be obtained and vitals that have been documented are patient reported.  VideoError- Librarian, academic were attempted between this provider and patient, however failed, due to patient having technical difficulties OR patient did not have access to video capability.  We continued and completed visit with audio only.   Persons Participating in Visit: Patient.  AWV Questionnaire: No: Patient Medicare AWV questionnaire was not completed prior to this visit.  Cardiac Risk Factors include: advanced age (>5men, >61 women);dyslipidemia;smoking/ tobacco exposure     Objective:    Today's Vitals   01/01/24 1028 01/01/24 1029  Weight: 130 lb (59 kg)   Height: 5' 1 (1.549 m)   PainSc:  5    Body mass index is 24.56 kg/m.     01/01/2024   10:37 AM 12/20/2022    4:31 PM 01/05/2022    8:19 AM 01/04/2021    8:44 AM  Advanced Directives  Does Patient Have a Medical Advance Directive? Yes Yes No;Yes No  Type of Estate agent of [place];Living will Healthcare Power of [place];Living  will Living will   Does patient want to make changes to medical advance directive?   No - Patient declined Yes (MAU/Ambulatory/Procedural Areas - Information given)  Copy of Healthcare Power of Attorney in Chart? Yes - validated most recent copy scanned in chart (See row information) Yes - validated most recent copy scanned in chart (See row information)    Would patient like information on creating a medical advance directive?   Yes (MAU/Ambulatory/Procedural Areas - Information given)     Current Medications (verified) Outpatient Encounter Medications as of 01/01/2024  Medication Sig   aspirin  EC (BAYER LOW DOSE) 81 MG tablet TAKE 1 TABLET (81 MG TOTAL) BY MOUTH DAILY. SWALLOW WHOLE.   cholecalciferol (VITAMIN D) 25 MCG (1000 UNIT) tablet Take 1 tablet by mouth daily at 12 noon.   ezetimibe  (ZETIA ) 10 MG tablet Take 1 tablet (10 mg total) by mouth daily. Please keep scheduled appointment for future refills. Thank you.   nitroGLYCERIN  (NITROSTAT ) 0.4 MG SL tablet Place 1 tablet (0.4 mg total) under the tongue every 5 (five) minutes as needed. May take up to 3 tablets in 15 min   rosuvastatin  (CRESTOR ) 10 MG tablet Take 1 tablet (10 mg total) by mouth daily. Please keep scheduled appointment for future refills. Thank you.   predniSONE  (DELTASONE ) 20 MG tablet 3 tabs by mouth daily x 3 days, then 2 tabs by mouth daily x 2 days then 1 tab by mouth daily x 2 days (Patient not taking: Reported on 01/01/2024)   No facility-administered encounter medications on file as of 01/01/2024.    Allergies (verified)  Patient has no known allergies.   History: Past Medical History:  Diagnosis Date   Arterial stenosis    Hyperlipidemia    Past Surgical History:  Procedure Laterality Date   NO PAST SURGERIES     Family History  Problem Relation Age of Onset   Breast cancer Mother 30   Throat cancer Mother    Lung cancer Mother        non smoker   Atrial fibrillation Father    Healthy Brother     Breast cancer Paternal Grandmother        45   Social History   Socioeconomic History   Marital status: Married    Spouse name: Merchant navy officer   Number of children: 1   Years of education: high school   Highest education level: Not on file  Occupational History   Occupation: retired  Tobacco Use   Smoking status: Every Day    Current packs/day: 0.50    Average packs/day: 0.5 packs/day for 41.0 years (20.5 ttl pk-yrs)    Types: Cigarettes   Smokeless tobacco: Never   Tobacco comments:    Down to 2 cigs a day, trying to quit - for the last 5 years  Vaping Use   Vaping status: Never Used  Substance and Sexual Activity   Alcohol use: No   Drug use: No   Sexual activity: Yes    Partners: Male    Birth control/protection: Post-menopausal  Other Topics Concern   Not on file  Social History Narrative   06/02/19   From: the area   Living: Husband, [place]   Work: retired, was a Land      Family: Son - Dorn - 3 grandchildren who live nearby      Enjoys: yardwork, gardening      Exercise: workouts in the morning 10-30 minutes, has knee pain, walks 30 minutes for her dog   Diet: meat, veggies, a lot of sweets      Safety   Seat belts: Yes    Guns: Yes  and secure   Safe in relationships: Yes    Social Drivers of Corporate investment banker Strain: Low Risk  (01/01/2024)   Overall Financial Resource Strain (CARDIA)    Difficulty of Paying Living Expenses: Not hard at all  Food Insecurity: No Food Insecurity (01/01/2024)   Hunger Vital Sign    Worried About Running Out of Food in the Last Year: Never true    Ran Out of Food in the Last Year: Never true  Transportation Needs: No Transportation Needs (01/01/2024)   PRAPARE - Administrator, Civil Service (Medical): No    Lack of Transportation (Non-Medical): No  Physical Activity: Sufficiently Active (01/01/2024)   Exercise Vital Sign    Days of Exercise per Week: 7 days    Minutes of Exercise per Session: 30  min  Stress: No Stress Concern Present (01/01/2024)   [employer] of Occupational Health - Occupational Stress Questionnaire    Feeling of Stress: Not at all  Social Connections: Moderately Isolated (01/01/2024)   Social Connection and Isolation Panel    Frequency of Communication with Friends and Family: More than three times a week    Frequency of Social Gatherings with Friends and Family: Twice a week    Attends Religious Services: Never    Database administrator or Organizations: No    Attends Banker Meetings: Never    Marital Status: Married  Tobacco Counseling Ready to quit: Not Answered Counseling given: Not Answered Tobacco comments: Down to 2 cigs a day, trying to quit - for the last 5 years   Clinical Intake:  Pre-visit preparation completed: Yes  Pain : 0-10 Pain Score: 5  Pain Type: Chronic pain Pain Location: Arm (shoulder) Pain Orientation: Right Pain Descriptors / Indicators: Aching Pain Onset: More than a month ago Pain Frequency: Intermittent Pain Relieving Factors: rest it Effect of Pain on Daily Activities: none  Pain Relieving Factors: rest it  BMI - recorded: 24.56 Nutritional Status: BMI of 19-24  Normal Nutritional Risks: None Diabetes: No  No results found for: HGBA1C   How often do you need to have someone help you when you read instructions, pamphlets, or other written materials from your doctor or pharmacy?: 1 - Never  Interpreter Needed?: No  Comments: lives with husband Information entered by :: B.Franki Alcaide,LPN   Activities of Daily Living     01/01/2024   10:38 AM  In your present state of health, do you have any difficulty performing the following activities:  Hearing? 0  Vision? 0  Difficulty concentrating or making decisions? 0  Walking or climbing stairs? 0  Dressing or bathing? 0  Doing errands, shopping? 0  Preparing Food and eating ? N  Using the Toilet? N  In the past six months, have you  accidently leaked urine? N  Do you have problems with loss of bowel control? N  Managing your Medications? N  Managing your Finances? N  Housekeeping or managing your Housekeeping? N    Patient Care Team: Corwin Antu, FNP as PCP - General (Family Medicine) Santo Stanly LABOR, MD as PCP - Cardiology (Cardiology) Estelle Service, MD as Consulting Physician (Obstetrics and Gynecology) Court Pulling, MD as Referring Physician (Dermatology)  I have updated your Care Teams any recent Medical Services you may have received from other providers in the past year.     Assessment:   This is a routine wellness examination for Rozann.  Hearing/Vision screen Hearing Screening - Comments:: Patient denies any hearing difficulties.   Vision Screening - Comments:: Pt says their vision is good with glasses  [facility]   Goals Addressed             This Visit's Progress    COMPLETED: Patient Stated       Maintain current activity     COMPLETED: Patient Stated   On track    Continue walking 30-45 minutes daily      COMPLETED: Patient Stated   On track    12/20/2022, denies goals at this time     Patient Stated       I would like to take a few more trips/travel more       Depression Screen     01/01/2024   10:35 AM 08/23/2023    8:52 AM 12/20/2022    4:32 PM 07/06/2022    8:25 AM 01/05/2022    8:10 AM 01/04/2021    8:27 AM 01/01/2020   11:43 AM  PHQ 2/9 Scores  PHQ - 2 Score 0 0 0 0 0 0 0  PHQ- 9 Score  0 0  0      Fall Risk     01/01/2024   10:32 AM 08/23/2023    8:52 AM 12/20/2022    4:31 PM 07/06/2022    8:24 AM 01/05/2022    8:10 AM  Fall Risk   Falls in the past year? 0  0 0 0 0  Number falls in past yr: 0 0 0 0 0  Injury with Fall? 0 0 0 0 0  Risk for fall due to : No Fall Risks No Fall Risks Medication side effect    Follow up Education provided;Falls prevention discussed Falls evaluation completed Falls prevention discussed;Falls evaluation  completed Falls evaluation completed;Education provided;Falls prevention discussed     MEDICARE RISK AT HOME:  Medicare Risk at Home Any stairs in or around the home?: Yes If so, are there any without handrails?: Yes Home free of loose throw rugs in walkways, pet beds, electrical cords, etc?: Yes Adequate lighting in your home to reduce risk of falls?: Yes Life alert?: No Use of a cane, walker or w/c?: No Grab bars in the bathroom?: Yes Shower chair or bench in shower?: Yes Elevated toilet seat or a handicapped toilet?: No  TIMED UP AND GO:  Was the test performed?  No  Cognitive Function: 6CIT completed        01/01/2024   10:38 AM 12/20/2022    4:32 PM  6CIT Screen  What Year? 0 points 0 points  What month? 0 points 0 points  What time? 0 points 0 points  Count back from 20 0 points 0 points  Months in reverse 0 points 0 points  Repeat phrase 0 points 0 points  Total Score 0 points 0 points    Immunizations Immunization History  Administered Date(s) Administered   Fluad Quad(high Dose 65+) 01/04/2021, 01/05/2022   Fluad Trivalent(High Dose 65+) 01/09/2023   Influenza Whole 01/05/2010   Influenza,inj,Quad PF,6+ Mos 12/12/2013, 01/01/2015, 12/24/2018, 01/01/2020   PFIZER(Purple Top)SARS-COV-2 Vaccination 06/22/2019, 07/14/2019, 03/08/2020   PNEUMOCOCCAL CONJUGATE-20 01/05/2022   Pneumococcal Polysaccharide-23 01/01/2020   Td 01/05/2010   Tdap 01/01/2020    Screening Tests Health Maintenance  Topic Date Due   Zoster Vaccines- Shingrix (1 of 2) Never done   Lung Cancer Screening  06/08/2023   Influenza Vaccine  11/09/2023   COVID-19 Vaccine (4 - 2025-26 season) 12/10/2023   DEXA SCAN  01/09/2024 (Originally 04/27/2020)   Medicare Annual Wellness (AWV)  12/31/2024   Mammogram  11/12/2025   Colonoscopy  03/14/2029   DTaP/Tdap/Td (3 - Td or Tdap) 12/31/2029   Pneumococcal Vaccine: 50+ Years  Completed   Hepatitis C Screening  Completed   HPV VACCINES  Aged Out    Meningococcal B Vaccine  Aged Out   Fecal DNA (Cologuard)  Discontinued    Health Maintenance Items Addressed: None due at this time. Pt will receive vaccines at their pharmcy when decided to obtain   Additional Screening:  Vision Screening: Recommended annual ophthalmology exams for early detection of glaucoma and other disorders of the eye. Is the patient up to date with their annual eye exam?  Yes  Who is the provider or what is the name of the office in which the patient attends annual eye exams? [facility] transitioning to Dr H [place]  Dental Screening: Recommended annual dental exams for proper oral hygiene  Community Resource Referral / Chronic Care Management: CRR required this visit?  No   CCM required this visit?  No   Plan:    I have personally reviewed and noted the following in the patient's chart:   Medical and social history Use of alcohol, tobacco or illicit drugs  Current medications and supplements including opioid prescriptions. Patient is not currently taking opioid prescriptions. Functional ability and status Nutritional status Physical activity Advanced directives List of other physicians  Hospitalizations, surgeries, and ER visits in previous 12 months Vitals Screenings to include cognitive, depression, and falls Referrals and appointments  In addition, I have reviewed and discussed with patient certain preventive protocols, quality metrics, and best practice recommendations. A written personalized care plan for preventive services as well as general preventive health recommendations were provided to patient.   Erminio LITTIE Saris, LPN   [id number]   After Visit Summary: (MyChart) Due to this being a telephonic visit, the after visit summary with patients personalized plan was offered to patient via MyChart   Notes: Nothing significant to report at this time.

## 2024-01-10 ENCOUNTER — Encounter: Payer: Self-pay | Admitting: Family

## 2024-01-10 ENCOUNTER — Ambulatory Visit: Payer: Medicare HMO | Admitting: Family

## 2024-01-10 ENCOUNTER — Ambulatory Visit
Admission: RE | Admit: 2024-01-10 | Discharge: 2024-01-10 | Disposition: A | Source: Ambulatory Visit | Attending: Family

## 2024-01-10 VITALS — BP 122/76 | HR 74 | Temp 98.1°F | Ht 61.0 in | Wt 127.4 lb

## 2024-01-10 DIAGNOSIS — M25511 Pain in right shoulder: Secondary | ICD-10-CM | POA: Diagnosis not present

## 2024-01-10 DIAGNOSIS — E782 Mixed hyperlipidemia: Secondary | ICD-10-CM | POA: Diagnosis not present

## 2024-01-10 DIAGNOSIS — M19011 Primary osteoarthritis, right shoulder: Secondary | ICD-10-CM | POA: Diagnosis not present

## 2024-01-10 DIAGNOSIS — G8929 Other chronic pain: Secondary | ICD-10-CM | POA: Diagnosis not present

## 2024-01-10 DIAGNOSIS — Z0000 Encounter for general adult medical examination without abnormal findings: Secondary | ICD-10-CM | POA: Diagnosis not present

## 2024-01-10 DIAGNOSIS — Z23 Encounter for immunization: Secondary | ICD-10-CM

## 2024-01-10 DIAGNOSIS — Z87891 Personal history of nicotine dependence: Secondary | ICD-10-CM

## 2024-01-10 LAB — LIPID PANEL
Cholesterol: 138 mg/dL (ref 0–200)
HDL: 61.1 mg/dL (ref >39.00)
LDL Cholesterol: 61 mg/dL (ref 0–99)
NonHDL: 76.48
Total CHOL/HDL Ratio: 2
Triglycerides: 75 mg/dL (ref 0.0–149.0)
VLDL: 15 mg/dL (ref 0.0–40.0)

## 2024-01-10 LAB — COMPREHENSIVE METABOLIC PANEL WITH GFR
ALT: 12 U/L (ref 0–35)
AST: 21 U/L (ref 0–37)
Albumin: 4.5 g/dL (ref 3.5–5.2)
Alkaline Phosphatase: 50 U/L (ref 39–117)
BUN: 13 mg/dL (ref 6–23)
CO2: 31 meq/L (ref 19–32)
Calcium: 9.5 mg/dL (ref 8.4–10.5)
Chloride: 101 meq/L (ref 96–112)
Creatinine, Ser: 0.79 mg/dL (ref 0.40–1.20)
GFR: 76.71 mL/min (ref >60.00)
Glucose, Bld: 99 mg/dL (ref 70–99)
Potassium: 4.3 meq/L (ref 3.5–5.1)
Sodium: 139 meq/L (ref 135–145)
Total Bilirubin: 0.6 mg/dL (ref 0.2–1.2)
Total Protein: 7.2 g/dL (ref 6.0–8.3)

## 2024-01-10 LAB — CBC
HCT: 40.5 % (ref 36.0–46.0)
Hemoglobin: 13.4 g/dL (ref 12.0–15.0)
MCHC: 33.1 g/dL (ref 30.0–36.0)
MCV: 92 fl (ref 78.0–100.0)
Platelets: 155 K/uL (ref 150.0–400.0)
RBC: 4.4 Mil/uL (ref 3.87–5.11)
RDW: 12.9 % (ref 11.5–15.5)
WBC: 4.7 K/uL (ref 4.0–10.5)

## 2024-01-10 NOTE — Progress Notes (Signed)
 Subjective:  Patient ID: Deanna Bell, female    DOB: 1955/05/10  Age: 68 y.o. MRN: [id number]  Patient Care Team: Corwin Antu, FNP as PCP - General (Family Medicine) Santo Stanly LABOR, MD as PCP - Cardiology (Cardiology) Estelle Service, MD as Consulting Physician (Obstetrics and Gynecology) Court Pulling, MD as Referring Physician (Dermatology)   CC:  Chief Complaint  Patient presents with   Annual Exam    HPI Deanna Bell is a 68 y.o. female who presents today for an annual physical exam. She reports consuming a general diet. Walks several times a week, good 30 min every day if not more in the afternoon, limited by her knees She generally feels well. She reports sleeping fairly well. She does have additional problems to discuss today.   Vision:Within last year Dental:Receives regular dental care  Lung Cancer Screening with low-dose Chest CT: 06/08/22   Mammogram: 11/13/23 Colonoscopy: 03/14/22 every 7 years Bone density scan: will get ordered with GYN   Pt is with  acute concerns.   Discussed the use of AI scribe software for clinical note transcription with the patient, who gave verbal consent to proceed.  History of Present Illness Deanna Bell is a 68 year old female who presents with right arm and shoulder pain.  She has been experiencing right arm and shoulder pain for approximately one month. Initially perceived as a pulled muscle, the pain persisted longer than expected. It is intermittent, reaching an intensity of eight out of ten, particularly when raising her arm above her head. The pain sometimes radiates and moves around her arm, described as 'weird' and difficult to explain.  She was previously prescribed prednisone , which provided relief during the course of treatment, but the pain returned after discontinuation. She has not undergone a shoulder x-ray yet. The pain has become more intense and affects her range of motion, making it difficult  to lift her arm without discomfort.  She uses Voltaren  gel daily and takes Tylenol occasionally, but avoids ibuprofen due to its blood-thinning properties.  Her social history includes a mention of her son, who had a previous injury resulting in a 'boxer fracture' after an altercation. She also shares that her sister-in-law underwent physical therapy for a similar shoulder issue, which did not help her.  The pain in her shoulder does not always hurt in the same spot and can move around her arm.   Advanced Directives Patient does have advanced directives. She does not have a copy in the electronic medical record.   DEPRESSION SCREENING    01/10/2024    8:02 AM 01/01/2024   10:35 AM 08/23/2023    8:52 AM 12/20/2022    4:32 PM 07/06/2022    8:25 AM 01/05/2022    8:10 AM 01/04/2021    8:27 AM  PHQ 2/9 Scores  PHQ - 2 Score 0 0 0 0 0 0 0  PHQ- 9 Score 0  0 0  0      ROS: Negative unless specifically indicated above in HPI.    Current Outpatient Medications:    aspirin  EC (BAYER LOW DOSE) 81 MG tablet, TAKE 1 TABLET (81 MG TOTAL) BY MOUTH DAILY. SWALLOW WHOLE., Disp: 30 tablet, Rfl: 6   cholecalciferol (VITAMIN D) 25 MCG (1000 UNIT) tablet, Take 1 tablet by mouth daily at 12 noon., Disp: , Rfl:    ezetimibe  (ZETIA ) 10 MG tablet, Take 1 tablet (10 mg total) by mouth daily. Please keep scheduled appointment for future refills. Thank  you., Disp: 90 tablet, Rfl: 3   nitroGLYCERIN  (NITROSTAT ) 0.4 MG SL tablet, Place 1 tablet (0.4 mg total) under the tongue every 5 (five) minutes as needed. May take up to 3 tablets in 15 min, Disp: 25 tablet, Rfl: 3   rosuvastatin  (CRESTOR ) 10 MG tablet, Take 1 tablet (10 mg total) by mouth daily. Please keep scheduled appointment for future refills. Thank you., Disp: 90 tablet, Rfl: 3    Objective:    BP 122/76 (BP Location: Left Arm, Patient Position: Sitting, Cuff Size: Normal)   Pulse 74   Temp 98.1 F (36.7 C) (Temporal)   Ht 5' 1 (1.549 m)   Wt 127  lb 6.4 oz (57.8 kg)   SpO2 98%   BMI 24.07 kg/m   BP Readings from Last 3 Encounters:  01/10/24 122/76  08/23/23 110/80  05/18/23 (!) 112/50      Physical Exam Vitals reviewed.  Constitutional:      General: She is not in acute distress.    Appearance: Normal appearance. She is normal weight. She is not ill-appearing.  HENT:     Head: Normocephalic.     Right Ear: Tympanic membrane normal.     Left Ear: Tympanic membrane normal.     Nose: Nose normal.     Mouth/Throat:     Mouth: Mucous membranes are moist.  Eyes:     Extraocular Movements: Extraocular movements intact.     Pupils: Pupils are equal, round, and reactive to light.  Cardiovascular:     Rate and Rhythm: Normal rate and regular rhythm.  Pulmonary:     Effort: Pulmonary effort is normal.     Breath sounds: Normal breath sounds.  Abdominal:     General: Abdomen is flat. Bowel sounds are normal.     Palpations: Abdomen is soft.     Tenderness: There is no guarding or rebound.  Musculoskeletal:     Right shoulder: Tenderness and bony tenderness present. Decreased range of motion. Normal strength.     Cervical back: Normal range of motion.     Comments: Positive drop can   Skin:    General: Skin is warm.     Capillary Refill: Capillary refill takes less than 2 seconds.  Neurological:     General: No focal deficit present.     Mental Status: She is alert.  Psychiatric:        Mood and Affect: Mood normal.        Behavior: Behavior normal.        Thought Content: Thought content normal.        Judgment: Judgment normal.       Results       Assessment & Plan:   Assessment and Plan Assessment & Plan Right shoulder pain with adhesive capsulitis and possible rotator cuff involvement Right shoulder pain has persisted for a month, initially thought to be a muscle strain. Pain is intermittent, rated 8/10 when raising the arm above the head, with increased intensity. Limited range of motion suggests  adhesive capsulitis with possible rotator cuff involvement. Differential includes severe sprain or tendinitis. Physical exam reveals tightness and pain in the shoulder area, with good strength but limited range of motion. Previous prednisone  treatment was not well-tolerated. - Order right shoulder x-ray. - Refer to physical therapy for right shoulder to maintain range of motion and prevent further freezing. - Schedule appointment with Dr. Watt for evaluation and possible joint injection. - Advise regular use of Voltaren  gel. - Consider  heat application and exercises to maintain range of motion. - Discuss potential use of muscle relaxers if needed. - Advise against use of ibuprofen due to blood thinning effects. - MRI may be considered if x-ray and physical therapy do not improve symptoms. Discussed potential need for joint injection if pain persists.   Acute wellness visit Patient Counseling(The following topics were reviewed):  Preventative care handout given to pt  Health maintenance and immunizations reviewed. Please refer to Health maintenance section. Pt advised on safe sex, wearing seatbelts in car, and proper nutrition labwork ordered today for annual Dental health: Discussed importance of regular tooth brushing, flossing, and dental visits.         Follow-up: Return in about 1 year (around 01/09/2025) for f/u CPE.   Ginger Patrick, FNP

## 2024-01-11 ENCOUNTER — Ambulatory Visit: Payer: Self-pay | Admitting: Family

## 2024-01-13 NOTE — Progress Notes (Signed)
     Deanna Seville T. Lynkin Saini, MD, CAQ Sports Medicine [facility] [address] [place], [address]  Phone: [phone]  FAX: [phone]  Deanna Bell - 68 y.o. female  MRN [id number]  Date of Birth: May 09, 1955  Date: 01/14/2024  PCP: Corwin Antu, FNP  Referral: Corwin Antu, FNP  No chief complaint on file.  Subjective:   Deanna Bell is a 68 y.o. very pleasant female patient with There is no height or weight on file to calculate BMI. who presents with the following:  Discussed the use of AI scribe software for clinical note transcription with the patient, who gave verbal consent to proceed.  She is a very pleasant patient who presents with ongoing right-sided shoulder pain.  She saw my partner Dr. Avelina in May 2025 with some ongoing acute right shoulder pain.  At that point, she felt like Deanna Bell likely had rotator cuff tendinopathy and gave her some oral steroids.   History of Present Illness     Review of Systems is noted in the HPI, as appropriate  Objective:   There were no vitals taken for this visit.  GEN: No acute distress; alert,appropriate. PULM: Breathing comfortably in no respiratory distress PSYCH: Normally interactive.   Laboratory and Imaging Data:  Assessment and Plan:   No diagnosis found. Assessment & Plan   Medication Management during today's office visit: No orders of the defined types were placed in this encounter.  There are no discontinued medications.  Orders placed today for conditions managed today: No orders of the defined types were placed in this encounter.   Disposition: No follow-ups on file.  [employer] One speech-to-text software was used for transcription in this dictation.  Possible transcriptional errors can occur using Animal nutritionist.   Signed,  Jacques DASEN. Con Arganbright, MD   Outpatient Encounter Medications as of 01/14/2024  Medication Sig   aspirin  EC (BAYER  LOW DOSE) 81 MG tablet TAKE 1 TABLET (81 MG TOTAL) BY MOUTH DAILY. SWALLOW WHOLE.   cholecalciferol (VITAMIN D) 25 MCG (1000 UNIT) tablet Take 1 tablet by mouth daily at 12 noon.   ezetimibe  (ZETIA ) 10 MG tablet Take 1 tablet (10 mg total) by mouth daily. Please keep scheduled appointment for future refills. Thank you.   nitroGLYCERIN  (NITROSTAT ) 0.4 MG SL tablet Place 1 tablet (0.4 mg total) under the tongue every 5 (five) minutes as needed. May take up to 3 tablets in 15 min   rosuvastatin  (CRESTOR ) 10 MG tablet Take 1 tablet (10 mg total) by mouth daily. Please keep scheduled appointment for future refills. Thank you.   No facility-administered encounter medications on file as of 01/14/2024.

## 2024-01-14 ENCOUNTER — Ambulatory Visit (INDEPENDENT_AMBULATORY_CARE_PROVIDER_SITE_OTHER): Admitting: Family Medicine

## 2024-01-14 ENCOUNTER — Encounter: Payer: Self-pay | Admitting: Family Medicine

## 2024-01-14 VITALS — BP 116/62 | HR 84 | Temp 97.1°F | Ht 61.0 in | Wt 126.5 lb

## 2024-01-14 DIAGNOSIS — M7501 Adhesive capsulitis of right shoulder: Secondary | ICD-10-CM | POA: Diagnosis not present

## 2024-01-14 DIAGNOSIS — M25511 Pain in right shoulder: Secondary | ICD-10-CM

## 2024-01-14 DIAGNOSIS — G8929 Other chronic pain: Secondary | ICD-10-CM

## 2024-01-14 MED ORDER — TRIAMCINOLONE ACETONIDE 40 MG/ML IJ SUSP
40.0000 mg | Freq: Once | INTRAMUSCULAR | Status: AC
  Administered 2024-01-14: 40 mg via INTRA_ARTICULAR

## 2024-01-22 NOTE — Therapy (Signed)
 OUTPATIENT PHYSICAL THERAPY EVALUATION   Patient Name: Deanna Bell MRN: [id number] DOB:01/07/1956, 68 y.o., female Today's Date: 01/23/2024   END OF SESSION:  PT End of Session - 01/23/24 1025     Visit Number 1    Number of Visits 17    Date for Recertification  03/19/24    Authorization Type BCBS MCR    Progress Note Due on Visit 10    PT Start Time 1016    PT Stop Time 1100    PT Time Calculation (min) 44 min    Activity Tolerance Patient tolerated treatment well    Behavior During Therapy [facility] for tasks assessed/performed          Past Medical History:  Diagnosis Date   Arterial stenosis    Hyperlipidemia    Past Surgical History:  Procedure Laterality Date   NO PAST SURGERIES     Patient Active Problem List   Diagnosis Date Noted   History of tobacco use 01/10/2024   Chronic pain in right shoulder 01/10/2024   Acute pain of right shoulder 08/23/2023   History of squamous cell carcinoma 02/12/2023   Centrilobular emphysema (HCC) 01/09/2023   Heart murmur, systolic 05/22/2022   Aortic atherosclerosis 05/24/2021   Coronary artery disease involving native coronary artery of native heart without angina pectoris 05/24/2021   HLD (hyperlipidemia) 10/17/2016    PCP: Corwin Antu, FNP  REFERRING PROVIDER: Corwin Antu, FNP  REFERRING DIAG: Chronic pain in right shoulder  THERAPY DIAG:  Chronic right shoulder pain  Muscle weakness (generalized)  Rationale for Evaluation and Treatment: Rehabilitation  ONSET DATE: Chronic (Feb-March 2025)   SUBJECTIVE:       SUBJECTIVE STATEMENT: Patient reports right shoulder pain that started back in February where she was having pain and inability to lay on the right shoulder. She did have a cortisone shot recently that seems to have helped. Before the shot she could barley lift her arm, but can now raise it in front better but going out the side is still difficult.  Hand dominance: Right  PERTINENT  HISTORY: See PMH above  PAIN:  Are you having pain? Yes:  NPRS scale: 0/10 at rest, 5-6/10 when moving the shoulder Pain location: Right shoulder Pain description: Sharp, stabbing Aggravating factors: Moving the right shoulder Relieving factors: Rest  PRECAUTIONS: None  RED FLAGS: None   WEIGHT BEARING RESTRICTIONS: No  FALLS:  Has patient fallen in last 6 months? No  PLOF: Independent  PATIENT GOALS: Get rid of pain and be able to raise arm up   OBJECTIVE:  Note: Objective measures were completed at Evaluation unless otherwise noted. PATIENT SURVEYS:  PSFS: 1.75 Reaching: 3 Vacuuming: 2 Washing windows: 2 Sleeping on right side:0  COGNITION: Overall cognitive status: Within functional limits for tasks assessed     SENSATION: WFL  POSTURE: Rounded shoulder posture  UPPER EXTREMITY ROM:   Active ROM Right eval Left eval  Shoulder flexion 100   Shoulder extension    Shoulder abduction 60   Shoulder adduction    Shoulder internal rotation SIJ   Shoulder external rotation C7   Elbow flexion    Elbow extension    Wrist flexion    Wrist extension    Wrist ulnar deviation    Wrist radial deviation    Wrist pronation    Wrist supination    (Blank rows = not tested)  UPPER EXTREMITY MMT:  MMT Right eval Left eval  Shoulder flexion 4   Shoulder extension  Shoulder abduction 4-   Shoulder adduction    Shoulder internal rotation 4   Shoulder external rotation 4   Middle trapezius    Lower trapezius    Elbow flexion    Elbow extension    Wrist flexion    Wrist extension    Wrist ulnar deviation    Wrist radial deviation    Wrist pronation    Wrist supination    Grip strength (lbs)    (Blank rows = not tested)  Strength assessed within available range  JOINT MOBILITY TESTING:  Hypomobility of right GHJ  PALPATION:  Tender to palpation about the right shoulder                                                                                                                              TREATMENT  [facility] Adult PT Treatment:                                                DATE: 01/23/2024 Right GHJ mobs primarily inferior and posterior at various ranges of elevation Right scapulothoracic mobs Supine shoulder flexion with dowel Standing wall slide for shoulder elevation Standing IR behind back with dowel Step back stretch at counter for shoulder elevation Seated ER stretch with arm on table   Discussed progression of adhesive capsulitis and expectations with therapy and home program  PATIENT EDUCATION: Education details: Exam findings, POC, HEP Person educated: Patient Education method: Explanation, Demonstration, Tactile cues, Verbal cues, and Handouts Education comprehension: verbalized understanding, returned demonstration, verbal cues required, tactile cues required, and needs further education  HOME EXERCISE PROGRAM: Access Code: [email or web address]    ASSESSMENT: CLINICAL IMPRESSION: Patient is a 68 y.o. female who was seen today for physical therapy evaluation and treatment for chronic right shoulder pain and stiffness. She exhibits limitations in her right shoulder motion in all planes, and strength deficit of the right shoulder within her available range. Her symptoms do seem consistent with adhesive capsulitis and she has improved since receiving the joint injection.    OBJECTIVE IMPAIRMENTS: decreased activity tolerance, decreased ROM, decreased strength, postural dysfunction, and pain.   ACTIVITY LIMITATIONS: carrying, lifting, sleeping, bathing, dressing, reach over head, and hygiene/grooming  PARTICIPATION LIMITATIONS: cleaning, driving, and yard work  PERSONAL FACTORS: Time since onset of injury/illness/exacerbation are also affecting patient's functional outcome.   REHAB POTENTIAL: Good  CLINICAL DECISION MAKING: Stable/uncomplicated  EVALUATION COMPLEXITY: Low   GOALS: Goals reviewed with patient?  Yes  SHORT TERM GOALS: Target date: 02/20/2024  Patient will be I with initial HEP in order to progress with therapy. Baseline: HEP provided at eval Goal status: INITIAL  2.  Patient will report right shoulder pain with activity </= 3/10 in order to reduce functional limitations Baseline: pain 5-6/10 Goal status: INITIAL  LONG TERM GOALS: Target date: 03/19/2024  Patient will be I with final HEP to maintain progress from PT. Baseline: HEP provided at eval Goal status: INITIAL  2.  Patient will report PSFS >/= 6 in order to indicate improvement in their functional ability. Baseline: 1.75 Goal status: INITIAL  3.  Patient will demonstrate right shoulder elevation AROM >/= 140 deg in order to improve overhead reach Baseline: 100 deg Goal status: INITIAL  4.  Patient will demonstrate functional IR reach behind back >/= T12 in order to improve dressing ability Baseline: reach to SIJ Goal status: INITIAL   PLAN: PT FREQUENCY: 1-2x/week  PT DURATION: 8 weeks  PLANNED INTERVENTIONS: 97164- PT Re-evaluation, 97750- Physical Performance Testing, 97110-Therapeutic exercises, 97530- Therapeutic activity, 97112- Neuromuscular re-education, 97535- Self Care, 02859- Manual therapy, 20560 (1-2 muscles), 20561 (3+ muscles)- Dry Needling, Patient/Family education, Joint mobilization, Joint manipulation, Spinal manipulation, Spinal mobilization, Cryotherapy, and Moist heat  PLAN FOR NEXT SESSION: Review HEP and progress PRN, manual/mobs/stretching for right shoulder, incorporate shoulder strengthening as tolerated   Elaine Daring, PT, DPT, LAT, ATC 01/23/24  12:13 PM Phone: [phone] Fax: [phone]

## 2024-01-23 ENCOUNTER — Encounter: Payer: Self-pay | Admitting: Physical Therapy

## 2024-01-23 ENCOUNTER — Other Ambulatory Visit: Payer: Self-pay

## 2024-01-23 ENCOUNTER — Ambulatory Visit: Admitting: Physical Therapy

## 2024-01-23 DIAGNOSIS — M6281 Muscle weakness (generalized): Secondary | ICD-10-CM

## 2024-01-23 DIAGNOSIS — G8929 Other chronic pain: Secondary | ICD-10-CM | POA: Diagnosis not present

## 2024-01-23 DIAGNOSIS — M25511 Pain in right shoulder: Secondary | ICD-10-CM

## 2024-01-23 NOTE — Patient Instructions (Signed)
 Access Code: [email or web address] URL: [email or web address] Date: 01/23/2024 Prepared by: Elaine Daring  Exercises - Supine Shoulder Flexion Extension AAROM with Dowel  - 2-3 x daily - 10 reps - 5 seconds hold - Standing shoulder flexion wall slides  - 2-3 x daily - 10 reps - 5 seconds hold - Standing Shoulder Internal Rotation AAROM with Dowel  - 2-3 x daily - 10 reps - 5 seconds hold - Standing 'L' Stretch at Counter  - 2-3 x daily - 10 reps - 5 seconds hold - Seated Shoulder External Rotation PROM on Table  - 2-3 x daily - 10 reps - 5 seconds hold

## 2024-02-04 ENCOUNTER — Ambulatory Visit: Admitting: Physical Therapy

## 2024-02-04 ENCOUNTER — Other Ambulatory Visit: Payer: Self-pay

## 2024-02-04 ENCOUNTER — Encounter: Payer: Self-pay | Admitting: Physical Therapy

## 2024-02-04 DIAGNOSIS — M25511 Pain in right shoulder: Secondary | ICD-10-CM

## 2024-02-04 DIAGNOSIS — M6281 Muscle weakness (generalized): Secondary | ICD-10-CM | POA: Diagnosis not present

## 2024-02-04 DIAGNOSIS — G8929 Other chronic pain: Secondary | ICD-10-CM | POA: Diagnosis not present

## 2024-02-04 NOTE — Patient Instructions (Signed)
 Access Code: [email or web address] URL: [email or web address] Date: 02/04/2024 Prepared by: Elaine Daring  Exercises - Supine Shoulder Flexion Extension AAROM with Dowel  - 2-3 x daily - 10 reps - 5 seconds hold - Standing shoulder flexion wall slides  - 2-3 x daily - 10 reps - 5 seconds hold - Standing Shoulder Internal Rotation AAROM with Dowel  - 2-3 x daily - 10 reps - 5 seconds hold - Standing 'L' Stretch at [employer]  - 2-3 x daily - 10 reps - 5 seconds hold - Seated Shoulder External Rotation PROM on Table  - 2-3 x daily - 10 reps - 5 seconds hold - Standing Shoulder Posterior Capsule Stretch  - 2-3 x daily - 10 reps - 5 seconds hold

## 2024-02-04 NOTE — Therapy (Signed)
 OUTPATIENT PHYSICAL THERAPY TREATMENT   Patient Name: Deanna Bell MRN: [id number] DOB:[birth year], 68 y.o., female Today's Date: 02/04/2024   END OF SESSION:  PT End of Session - 02/04/24 0935     Visit Number 2    Number of Visits 17    Date for Recertification  03/19/24    Authorization Type BCBS MCR    Progress Note Due on Visit 10    PT Start Time 0932    PT Stop Time 1015    PT Time Calculation (min) 43 min    Activity Tolerance Patient tolerated treatment well    Behavior During Therapy [facility] for tasks assessed/performed           Past Medical History:  Diagnosis Date   Arterial stenosis    Hyperlipidemia    Past Surgical History:  Procedure Laterality Date   NO PAST SURGERIES     Patient Active Problem List   Diagnosis Date Noted   History of tobacco use 01/10/2024   Chronic pain in right shoulder 01/10/2024   Acute pain of right shoulder 08/23/2023   History of squamous cell carcinoma 02/12/2023   Centrilobular emphysema (HCC) 01/09/2023   Heart murmur, systolic 05/22/2022   Aortic atherosclerosis 05/24/2021   Coronary artery disease involving native coronary artery of native heart without angina pectoris 05/24/2021   HLD (hyperlipidemia) 10/17/2016    PCP: Corwin Antu, FNP  REFERRING PROVIDER: Corwin Antu, FNP  REFERRING DIAG: Chronic pain in right shoulder  THERAPY DIAG:  Chronic right shoulder pain  Muscle weakness (generalized)  Rationale for Evaluation and Treatment: Rehabilitation  ONSET DATE: Chronic (Feb-March 2025)   SUBJECTIVE:       SUBJECTIVE STATEMENT: Patient reports she has been doing her stretching and it has been going good.   Eval: Patient reports right shoulder pain that started back in February where she was having pain and inability to lay on the right shoulder. She did have a cortisone shot recently that seems to have helped. Before the shot she could barley lift her arm, but can now raise it in front better  but going out the side is still difficult.  Hand dominance: Right  PERTINENT HISTORY: See PMH above  PAIN:  Are you having pain? Yes:  NPRS scale: 0/10 at rest, 5-6/10 when moving the shoulder Pain location: Right shoulder Pain description: Sharp, stabbing Aggravating factors: Moving the right shoulder Relieving factors: Rest  PRECAUTIONS: None  PATIENT GOALS: Get rid of pain and be able to raise arm up   OBJECTIVE:  Note: Objective measures were completed at Evaluation unless otherwise noted. PATIENT SURVEYS:  PSFS: 1.75 Reaching: 3 Vacuuming: 2 Washing windows: 2 Sleeping on right side: 0  POSTURE: Rounded shoulder posture  UPPER EXTREMITY ROM:   Active ROM Right eval Left eval Right 02/04/2024  Shoulder flexion 100  130  Shoulder extension     Shoulder abduction 60    Shoulder adduction     Shoulder internal rotation SIJ  L5  Shoulder external rotation C7    Elbow flexion     Elbow extension     Wrist flexion     Wrist extension     Wrist ulnar deviation     Wrist radial deviation     Wrist pronation     Wrist supination     (Blank rows = not tested)  UPPER EXTREMITY MMT:  MMT Right eval Left eval  Shoulder flexion 4   Shoulder extension    Shoulder abduction 4-  Shoulder adduction    Shoulder internal rotation 4   Shoulder external rotation 4   Middle trapezius    Lower trapezius    Elbow flexion    Elbow extension    Wrist flexion    Wrist extension    Wrist ulnar deviation    Wrist radial deviation    Wrist pronation    Wrist supination    Grip strength (lbs)    (Blank rows = not tested)  Strength assessed within available range  JOINT MOBILITY TESTING:  Hypomobility of right GHJ  PALPATION:  Tender to palpation about the right shoulder                                                                                                                             TREATMENT  [facility] Adult PT Treatment:                                                 DATE: 02/04/2024 UBE L2 x 5 min (fwd/bwd) to improve shoulder endurance and workload capacity Right GHJ mobs primarily inferior and posterior at various ranges of elevation Right shoulder PROM all directions Wall slide for shoulder elevation Cross body stretch using doorframe and well arm assist  PATIENT EDUCATION: Education details: HEP update Person educated: Patient Education method: Explanation, Demonstration, Tactile cues, Verbal cues, and Handouts Education comprehension: verbalized understanding, returned demonstration, verbal cues required, tactile cues required, and needs further education  HOME EXERCISE PROGRAM: Access Code: [email or web address]    ASSESSMENT: CLINICAL IMPRESSION: Patient tolerated therapy well with no adverse effects. Therapy focused on improving right shoulder mobility with good tolerance. She does exhibit improvement in her right shoulder motion this visit and reports pain primarily with the supine dowel flexion exercise at home. She tolerated manual therapy well and focused on joint mobs. She did exhibit main limitation with PROM for shoulder IR. Updated HEP to work on cross body stretch as she reported this was uncomfortable. Patient would benefit from continued skilled PT to progress mobility and strength in order to reduce pain and maximize functional ability.   Eval: Patient is a 68 y.o. female who was seen today for physical therapy evaluation and treatment for chronic right shoulder pain and stiffness. She exhibits limitations in her right shoulder motion in all planes, and strength deficit of the right shoulder within her available range. Her symptoms do seem consistent with adhesive capsulitis and she has improved since receiving the joint injection.    OBJECTIVE IMPAIRMENTS: decreased activity tolerance, decreased ROM, decreased strength, postural dysfunction, and pain.   ACTIVITY LIMITATIONS: carrying, lifting, sleeping, bathing, dressing, reach  over head, and hygiene/grooming  PARTICIPATION LIMITATIONS: cleaning, driving, and yard work  PERSONAL FACTORS: Time since onset of injury/illness/exacerbation are also affecting patient's functional outcome.    GOALS: Goals reviewed with patient?  Yes  SHORT TERM GOALS: Target date: 02/20/2024  Patient will be I with initial HEP in order to progress with therapy. Baseline: HEP provided at eval Goal status: INITIAL  2.  Patient will report right shoulder pain with activity </= 3/10 in order to reduce functional limitations Baseline: pain 5-6/10 Goal status: INITIAL  LONG TERM GOALS: Target date: 03/19/2024  Patient will be I with final HEP to maintain progress from PT. Baseline: HEP provided at eval Goal status: INITIAL  2.  Patient will report PSFS >/= 6 in order to indicate improvement in their functional ability. Baseline: 1.75 Goal status: INITIAL  3.  Patient will demonstrate right shoulder elevation AROM >/= 140 deg in order to improve overhead reach Baseline: 100 deg Goal status: INITIAL  4.  Patient will demonstrate functional IR reach behind back >/= T12 in order to improve dressing ability Baseline: reach to SIJ Goal status: INITIAL   PLAN: PT FREQUENCY: 1-2x/week  PT DURATION: 8 weeks  PLANNED INTERVENTIONS: 97164- PT Re-evaluation, 97750- Physical Performance Testing, 97110-Therapeutic exercises, 97530- Therapeutic activity, 97112- Neuromuscular re-education, 97535- Self Care, 02859- Manual therapy, 20560 (1-2 muscles), 20561 (3+ muscles)- Dry Needling, Patient/Family education, Joint mobilization, Joint manipulation, Spinal manipulation, Spinal mobilization, Cryotherapy, and Moist heat  PLAN FOR NEXT SESSION: Review HEP and progress PRN, manual/mobs/stretching for right shoulder, incorporate shoulder strengthening as tolerated   Elaine Daring, PT, DPT, LAT, ATC 02/04/24  11:02 AM Phone: [phone] Fax: [phone]

## 2024-02-07 ENCOUNTER — Other Ambulatory Visit: Payer: Self-pay

## 2024-02-07 ENCOUNTER — Ambulatory Visit: Admitting: Physical Therapy

## 2024-02-07 ENCOUNTER — Encounter: Payer: Self-pay | Admitting: Physical Therapy

## 2024-02-07 DIAGNOSIS — G8929 Other chronic pain: Secondary | ICD-10-CM

## 2024-02-07 DIAGNOSIS — M6281 Muscle weakness (generalized): Secondary | ICD-10-CM | POA: Diagnosis not present

## 2024-02-07 DIAGNOSIS — M25511 Pain in right shoulder: Secondary | ICD-10-CM | POA: Diagnosis not present

## 2024-02-07 NOTE — Patient Instructions (Signed)
 Access Code: [email or web address] URL: [email or web address] Date: 02/07/2024 Prepared by: Elaine Daring  Exercises - Standing shoulder flexion wall slides  - 2 x daily - 10 reps - 5 seconds hold - Standing Shoulder Internal Rotation AAROM with Dowel  - 2 x daily - 10 reps - 5 seconds hold - Standing 'L' Stretch at [employer]  - 2 x daily - 10 reps - 5 seconds hold - Standing Shoulder Posterior Capsule Stretch  - 2 x daily - 10 reps - 5 seconds hold - Single Arm Doorway Pec Stretch at 90 Degrees Abduction  - 2 x daily - 10 reps - 5 seconds hold

## 2024-02-07 NOTE — Therapy (Signed)
 OUTPATIENT PHYSICAL THERAPY TREATMENT   Patient Name: Deanna Bell MRN: [id number] DOB:07/25/1955, 68 y.o., female Today's Date: 02/07/2024   END OF SESSION:  PT End of Session - 02/07/24 0952     Visit Number 3    Number of Visits 17    Date for Recertification  03/19/24    Authorization Type BCBS MCR    Progress Note Due on Visit 10    PT Start Time 0945    PT Stop Time 1025    PT Time Calculation (min) 40 min    Activity Tolerance Patient tolerated treatment well    Behavior During Therapy [facility] for tasks assessed/performed            Past Medical History:  Diagnosis Date   Arterial stenosis    Hyperlipidemia    Past Surgical History:  Procedure Laterality Date   NO PAST SURGERIES     Patient Active Problem List   Diagnosis Date Noted   History of tobacco use 01/10/2024   Chronic pain in right shoulder 01/10/2024   Acute pain of right shoulder 08/23/2023   History of squamous cell carcinoma 02/12/2023   Centrilobular emphysema (HCC) 01/09/2023   Heart murmur, systolic 05/22/2022   Aortic atherosclerosis 05/24/2021   Coronary artery disease involving native coronary artery of native heart without angina pectoris 05/24/2021   HLD (hyperlipidemia) 10/17/2016    PCP: Corwin Antu, FNP  REFERRING PROVIDER: Corwin Antu, FNP  REFERRING DIAG: Chronic pain in right shoulder  THERAPY DIAG:  Chronic right shoulder pain  Muscle weakness (generalized)  Rationale for Evaluation and Treatment: Rehabilitation  ONSET DATE: Chronic (Feb-March 2025)   SUBJECTIVE:       SUBJECTIVE STATEMENT: Patient reports her shoulder has been feeling good and has been consistent with her stretching.  Eval: Patient reports right shoulder pain that started back in February where she was having pain and inability to lay on the right shoulder. She did have a cortisone shot recently that seems to have helped. Before the shot she could barley lift her arm, but can now raise it  in front better but going out the side is still difficult.  Hand dominance: Right  PERTINENT HISTORY: See PMH above  PAIN:  Are you having pain? Yes:  NPRS scale: 0/10 at rest, 5-6/10 when moving the shoulder Pain location: Right shoulder Pain description: Sharp, stabbing Aggravating factors: Moving the right shoulder Relieving factors: Rest  PRECAUTIONS: None  PATIENT GOALS: Get rid of pain and be able to raise arm up   OBJECTIVE:  Note: Objective measures were completed at Evaluation unless otherwise noted. PATIENT SURVEYS:  PSFS: 1.75 Reaching: 3 Vacuuming: 2 Washing windows: 2 Sleeping on right side: 0  POSTURE: Rounded shoulder posture  UPPER EXTREMITY ROM:   Active ROM Right eval Left eval Right 02/04/2024 Right 02/07/2024  Shoulder flexion 100  130 140  Shoulder extension      Shoulder abduction 60     Shoulder adduction      Shoulder internal rotation SIJ  L5 L1  Shoulder external rotation C7   T1  Elbow flexion      Elbow extension      Wrist flexion      Wrist extension      Wrist ulnar deviation      Wrist radial deviation      Wrist pronation      Wrist supination      (Blank rows = not tested)  UPPER EXTREMITY MMT:  MMT Right  eval Left eval  Shoulder flexion 4   Shoulder extension    Shoulder abduction 4-   Shoulder adduction    Shoulder internal rotation 4   Shoulder external rotation 4   Middle trapezius    Lower trapezius    Elbow flexion    Elbow extension    Wrist flexion    Wrist extension    Wrist ulnar deviation    Wrist radial deviation    Wrist pronation    Wrist supination    Grip strength (lbs)    (Blank rows = not tested)  Strength assessed within available range  JOINT MOBILITY TESTING:  Hypomobility of right GHJ  PALPATION:  Tender to palpation about the right shoulder                                                                                                                             TREATMENT   [facility] Adult PT Treatment:                                                DATE: 02/07/2024 UBE L2 x 5 min (fwd/bwd) to improve shoulder endurance and workload capacity Right GHJ mobs primarily inferior and posterior at various ranges of elevation Right shoulder PROM all directions Step back stretch at counter 10 x 5 sec Wall slide for shoulder elevation 10 x 5 sec Shoulder ER stretch at 90 deg abduction at wall 10 x 5 sec Shoulder IR behind back stretch using towel 10 x 5 sec  PATIENT EDUCATION: Education details: HEP update Person educated: Patient Education method: Explanation, Demonstration, Tactile cues, Verbal cues, and Handouts Education comprehension: verbalized understanding, returned demonstration, verbal cues required, tactile cues required, and needs further education  HOME EXERCISE PROGRAM: Access Code: [email or web address]    ASSESSMENT: CLINICAL IMPRESSION: Patient tolerated therapy well with no adverse effects. Therapy focused on improving right shoulder mobility and range of motion with good tolerance. She exhibits improvement in her right shoulder motion this visit. She tolerated manual therapy well and focused on joint mobs and PROM. She does report pain with shoulder rotational movements at 90 deg abduction and limited more with shoulder IR. Updated HEP to progress shoulder stretching. Patient would benefit from continued skilled PT to progress mobility and strength in order to reduce pain and maximize functional ability.   Eval: Patient is a 68 y.o. female who was seen today for physical therapy evaluation and treatment for chronic right shoulder pain and stiffness. She exhibits limitations in her right shoulder motion in all planes, and strength deficit of the right shoulder within her available range. Her symptoms do seem consistent with adhesive capsulitis and she has improved since receiving the joint injection.    OBJECTIVE IMPAIRMENTS: decreased activity tolerance, decreased  ROM, decreased strength, postural dysfunction, and pain.   ACTIVITY LIMITATIONS:  carrying, lifting, sleeping, bathing, dressing, reach over head, and hygiene/grooming  PARTICIPATION LIMITATIONS: cleaning, driving, and yard work  PERSONAL FACTORS: Time since onset of injury/illness/exacerbation are also affecting patient's functional outcome.    GOALS: Goals reviewed with patient? Yes  SHORT TERM GOALS: Target date: 02/20/2024  Patient will be I with initial HEP in order to progress with therapy. Baseline: HEP provided at eval Goal status: INITIAL  2.  Patient will report right shoulder pain with activity </= 3/10 in order to reduce functional limitations Baseline: pain 5-6/10 Goal status: INITIAL  LONG TERM GOALS: Target date: 03/19/2024  Patient will be I with final HEP to maintain progress from PT. Baseline: HEP provided at eval Goal status: INITIAL  2.  Patient will report PSFS >/= 6 in order to indicate improvement in their functional ability. Baseline: 1.75 Goal status: INITIAL  3.  Patient will demonstrate right shoulder elevation AROM >/= 140 deg in order to improve overhead reach Baseline: 100 deg Goal status: INITIAL  4.  Patient will demonstrate functional IR reach behind back >/= T12 in order to improve dressing ability Baseline: reach to SIJ Goal status: INITIAL   PLAN: PT FREQUENCY: 1-2x/week  PT DURATION: 8 weeks  PLANNED INTERVENTIONS: 97164- PT Re-evaluation, 97750- Physical Performance Testing, 97110-Therapeutic exercises, 97530- Therapeutic activity, 97112- Neuromuscular re-education, 97535- Self Care, 02859- Manual therapy, 20560 (1-2 muscles), 20561 (3+ muscles)- Dry Needling, Patient/Family education, Joint mobilization, Joint manipulation, Spinal manipulation, Spinal mobilization, Cryotherapy, and Moist heat  PLAN FOR NEXT SESSION: Review HEP and progress PRN, manual/mobs/stretching for right shoulder, incorporate shoulder strengthening as  tolerated   Elaine Daring, PT, DPT, LAT, ATC 02/07/24  10:31 AM Phone: [phone] Fax: [phone]

## 2024-02-11 ENCOUNTER — Encounter: Payer: Self-pay | Admitting: Physical Therapy

## 2024-02-11 ENCOUNTER — Other Ambulatory Visit: Payer: Self-pay

## 2024-02-11 ENCOUNTER — Ambulatory Visit: Admitting: Physical Therapy

## 2024-02-11 DIAGNOSIS — M25511 Pain in right shoulder: Secondary | ICD-10-CM

## 2024-02-11 DIAGNOSIS — M6281 Muscle weakness (generalized): Secondary | ICD-10-CM | POA: Diagnosis not present

## 2024-02-11 DIAGNOSIS — G8929 Other chronic pain: Secondary | ICD-10-CM | POA: Diagnosis not present

## 2024-02-11 NOTE — Therapy (Signed)
 OUTPATIENT PHYSICAL THERAPY TREATMENT   Patient Name: BRITNY RIEL MRN: [id number] DOB:18-May-1955, 68 y.o., female Today's Date: 02/11/2024   END OF SESSION:  PT End of Session - 02/11/24 0931     Visit Number 4    Number of Visits 17    Date for Recertification  03/19/24    Authorization Type BCBS MCR    Progress Note Due on Visit 10    PT Start Time 0930    PT Stop Time 1010    PT Time Calculation (min) 40 min    Activity Tolerance Patient tolerated treatment well    Behavior During Therapy [facility] for tasks assessed/performed             Past Medical History:  Diagnosis Date   Arterial stenosis    Hyperlipidemia    Past Surgical History:  Procedure Laterality Date   NO PAST SURGERIES     Patient Active Problem List   Diagnosis Date Noted   History of tobacco use 01/10/2024   Chronic pain in right shoulder 01/10/2024   Acute pain of right shoulder 08/23/2023   History of squamous cell carcinoma 02/12/2023   Centrilobular emphysema (HCC) 01/09/2023   Heart murmur, systolic 05/22/2022   Aortic atherosclerosis 05/24/2021   Coronary artery disease involving native coronary artery of native heart without angina pectoris 05/24/2021   HLD (hyperlipidemia) 10/17/2016    PCP: Corwin Antu, FNP  REFERRING PROVIDER: Corwin Antu, FNP  REFERRING DIAG: Chronic pain in right shoulder  THERAPY DIAG:  Chronic right shoulder pain  Muscle weakness (generalized)  Rationale for Evaluation and Treatment: Rehabilitation  ONSET DATE: Chronic (Feb-March 2025)   SUBJECTIVE:       SUBJECTIVE STATEMENT: Patient reports her shoulder has been feeling good and has been consistent with her stretching.  Eval: Patient reports right shoulder pain that started back in February where she was having pain and inability to lay on the right shoulder. She did have a cortisone shot recently that seems to have helped. Before the shot she could barley lift her arm, but can now raise  it in front better but going out the side is still difficult.  Hand dominance: Right  PERTINENT HISTORY: See PMH above  PAIN:  Are you having pain? Yes:  NPRS scale: 0/10 at rest, 5-6/10 when moving the shoulder Pain location: Right shoulder Pain description: Sharp, stabbing Aggravating factors: Moving the right shoulder Relieving factors: Rest  PRECAUTIONS: None  PATIENT GOALS: Get rid of pain and be able to raise arm up   OBJECTIVE:  Note: Objective measures were completed at Evaluation unless otherwise noted. PATIENT SURVEYS:  PSFS: 1.75 Reaching: 3 Vacuuming: 2 Washing windows: 2 Sleeping on right side: 0  POSTURE: Rounded shoulder posture  UPPER EXTREMITY ROM:   Active ROM Right eval Left eval Right 02/04/2024 Right 02/07/2024 Right 02/11/2024  Shoulder flexion 100  130 140 143  Shoulder extension       Shoulder abduction 60      Shoulder adduction       Shoulder internal rotation SIJ  L5 L1   Shoulder external rotation C7   T1   Elbow flexion       Elbow extension       Wrist flexion       Wrist extension       Wrist ulnar deviation       Wrist radial deviation       Wrist pronation       Wrist supination       (  Blank rows = not tested)  UPPER EXTREMITY MMT:  MMT Right eval Left eval  Shoulder flexion 4   Shoulder extension    Shoulder abduction 4-   Shoulder adduction    Shoulder internal rotation 4   Shoulder external rotation 4   Middle trapezius    Lower trapezius    Elbow flexion    Elbow extension    Wrist flexion    Wrist extension    Wrist ulnar deviation    Wrist radial deviation    Wrist pronation    Wrist supination    Grip strength (lbs)    (Blank rows = not tested)  Strength assessed within available range  JOINT MOBILITY TESTING:  Hypomobility of right GHJ  PALPATION:  Tender to palpation about the right shoulder                                                                                                                              TREATMENT  [facility] Adult PT Treatment:                                                DATE: 02/11/2024 UBE L2 x 4 min (fwd/bwd) to improve shoulder endurance and workload capacity Right GHJ mobs all directions at various ranges of elevation Right shoulder PROM all directions Sleeper stretch 5 x 10 sec Wall slide for shoulder elevation 10 x 5 sec Shoulder ER stretch at 90 deg abduction at wall 5 x 5 sec Shoulder IR behind back stretch using towel 5 x 5 sec Shoulder ER with red 2 x 10 Scaption with 2# 2 x 10  PATIENT EDUCATION: Education details: HEP update Person educated: Patient Education method: Explanation, Demonstration, Tactile cues, Verbal cues, and Handouts Education comprehension: verbalized understanding, returned demonstration, verbal cues required, tactile cues required, and needs further education  HOME EXERCISE PROGRAM: Access Code: [email or web address]    ASSESSMENT: CLINICAL IMPRESSION: Patient tolerated therapy well with no adverse effects. Therapy continues to focus on improving right shoulder mobility and active motion with good tolerance. She continues to remain limited mainly with shoulder IR so updated HEP to progress shoulder stretching for the posterior cuff. Incorporated some shoulder strengthening this visit with good tolerance. Patient would benefit from continued skilled PT to progress mobility and strength in order to reduce pain and maximize functional ability.   Eval: Patient is a 68 y.o. female who was seen today for physical therapy evaluation and treatment for chronic right shoulder pain and stiffness. She exhibits limitations in her right shoulder motion in all planes, and strength deficit of the right shoulder within her available range. Her symptoms do seem consistent with adhesive capsulitis and she has improved since receiving the joint injection.    OBJECTIVE IMPAIRMENTS: decreased activity tolerance, decreased ROM, decreased strength,  postural dysfunction, and pain.  ACTIVITY LIMITATIONS: carrying, lifting, sleeping, bathing, dressing, reach over head, and hygiene/grooming  PARTICIPATION LIMITATIONS: cleaning, driving, and yard work  PERSONAL FACTORS: Time since onset of injury/illness/exacerbation are also affecting patient's functional outcome.    GOALS: Goals reviewed with patient? Yes  SHORT TERM GOALS: Target date: 02/20/2024  Patient will be I with initial HEP in order to progress with therapy. Baseline: HEP provided at eval Goal status: INITIAL  2.  Patient will report right shoulder pain with activity </= 3/10 in order to reduce functional limitations Baseline: pain 5-6/10 Goal status: INITIAL  LONG TERM GOALS: Target date: 03/19/2024  Patient will be I with final HEP to maintain progress from PT. Baseline: HEP provided at eval Goal status: INITIAL  2.  Patient will report PSFS >/= 6 in order to indicate improvement in their functional ability. Baseline: 1.75 Goal status: INITIAL  3.  Patient will demonstrate right shoulder elevation AROM >/= 140 deg in order to improve overhead reach Baseline: 100 deg Goal status: INITIAL  4.  Patient will demonstrate functional IR reach behind back >/= T12 in order to improve dressing ability Baseline: reach to SIJ Goal status: INITIAL   PLAN: PT FREQUENCY: 1-2x/week  PT DURATION: 8 weeks  PLANNED INTERVENTIONS: 97164- PT Re-evaluation, 97750- Physical Performance Testing, 97110-Therapeutic exercises, 97530- Therapeutic activity, 97112- Neuromuscular re-education, 97535- Self Care, 02859- Manual therapy, 20560 (1-2 muscles), 20561 (3+ muscles)- Dry Needling, Patient/Family education, Joint mobilization, Joint manipulation, Spinal manipulation, Spinal mobilization, Cryotherapy, and Moist heat  PLAN FOR NEXT SESSION: Review HEP and progress PRN, manual/mobs/stretching for right shoulder, incorporate shoulder strengthening as tolerated   Elaine Daring,  PT, DPT, LAT, ATC 02/11/24  10:18 AM Phone: [phone] Fax: [phone]

## 2024-02-11 NOTE — Patient Instructions (Signed)
 Access Code: [email or web address] URL: [email or web address] Date: 02/11/2024 Prepared by: Elaine Daring  Exercises - Sleeper Stretch  - 2 x daily - 5 reps - 10 seconds hold - Standing shoulder flexion wall slides  - 2 x daily - 10 reps - 5 seconds hold - Standing 'L' Stretch at [employer]  - 2 x daily - 10 reps - 5 seconds hold - Standing Shoulder Posterior Capsule Stretch  - 2 x daily - 10 reps - 5 seconds hold - Single Arm Doorway Pec Stretch at 90 Degrees Abduction  - 2 x daily - 10 reps - 5 seconds hold - Standing Shoulder Internal Rotation Stretch with Towel  - 2 x daily - 10 reps - 5 seconds hold

## 2024-02-13 ENCOUNTER — Encounter: Admitting: Physical Therapy

## 2024-02-18 ENCOUNTER — Encounter: Payer: Self-pay | Admitting: Physical Therapy

## 2024-02-18 ENCOUNTER — Other Ambulatory Visit: Payer: Self-pay

## 2024-02-18 ENCOUNTER — Ambulatory Visit: Admitting: Physical Therapy

## 2024-02-18 DIAGNOSIS — G8929 Other chronic pain: Secondary | ICD-10-CM

## 2024-02-18 DIAGNOSIS — M6281 Muscle weakness (generalized): Secondary | ICD-10-CM | POA: Diagnosis not present

## 2024-02-18 DIAGNOSIS — M25511 Pain in right shoulder: Secondary | ICD-10-CM

## 2024-02-18 NOTE — Therapy (Signed)
 OUTPATIENT PHYSICAL THERAPY TREATMENT   Patient Name: Deanna Bell MRN: [id number] DOB:1956/02/04, 68 y.o., female Today's Date: 02/18/2024   END OF SESSION:  PT End of Session - 02/18/24 0927     Visit Number 5    Number of Visits 17    Date for Recertification  03/19/24    Authorization Type BCBS MCR    Progress Note Due on Visit 10    PT Start Time 0930    PT Stop Time 1010    PT Time Calculation (min) 40 min    Activity Tolerance Patient tolerated treatment well    Behavior During Therapy [facility] for tasks assessed/performed              Past Medical History:  Diagnosis Date   Arterial stenosis    Hyperlipidemia    Past Surgical History:  Procedure Laterality Date   NO PAST SURGERIES     Patient Active Problem List   Diagnosis Date Noted   History of tobacco use 01/10/2024   Chronic pain in right shoulder 01/10/2024   Acute pain of right shoulder 08/23/2023   History of squamous cell carcinoma 02/12/2023   Centrilobular emphysema (HCC) 01/09/2023   Heart murmur, systolic 05/22/2022   Aortic atherosclerosis 05/24/2021   Coronary artery disease involving native coronary artery of native heart without angina pectoris 05/24/2021   HLD (hyperlipidemia) 10/17/2016    PCP: Corwin Antu, FNP  REFERRING PROVIDER: Corwin Antu, FNP  REFERRING DIAG: Chronic pain in right shoulder  THERAPY DIAG:  Chronic right shoulder pain  Muscle weakness (generalized)  Rationale for Evaluation and Treatment: Rehabilitation  ONSET DATE: Chronic (Feb-March 2025)   SUBJECTIVE:       SUBJECTIVE STATEMENT: Patient reports she has trouble doing the stretch lying on her side. She feels like things are still getting better.   Eval: Patient reports right shoulder pain that started back in February where she was having pain and inability to lay on the right shoulder. She did have a cortisone shot recently that seems to have helped. Before the shot she could barley lift her  arm, but can now raise it in front better but going out the side is still difficult.  Hand dominance: Right  PERTINENT HISTORY: See PMH above  PAIN:  Are you having pain? Yes:  NPRS scale: 0/10 at rest, 3/10 at worst Pain location: Right shoulder Pain description: Sharp, stabbing Aggravating factors: Moving the right shoulder Relieving factors: Rest  PRECAUTIONS: None  PATIENT GOALS: Get rid of pain and be able to raise arm up   OBJECTIVE:  Note: Objective measures were completed at Evaluation unless otherwise noted. PATIENT SURVEYS:  PSFS: 1.75 Reaching: 3 Vacuuming: 2 Washing windows: 2 Sleeping on right side: 0  POSTURE: Rounded shoulder posture  UPPER EXTREMITY ROM:   Active ROM Right eval Left eval Right 02/04/2024 Right 02/07/2024 Right 02/11/2024 Right 02/18/2024  Shoulder flexion 100  130 140 143 145  Shoulder extension        Shoulder abduction 60     160  Shoulder adduction        Shoulder internal rotation SIJ  L5 L1  T12  Shoulder external rotation C7   T1  T2  Elbow flexion        Elbow extension        Wrist flexion        Wrist extension        Wrist ulnar deviation        Wrist radial  deviation        Wrist pronation        Wrist supination        (Blank rows = not tested)  UPPER EXTREMITY MMT:  MMT Right eval Left eval  Shoulder flexion 4   Shoulder extension    Shoulder abduction 4-   Shoulder adduction    Shoulder internal rotation 4   Shoulder external rotation 4   Middle trapezius    Lower trapezius    Elbow flexion    Elbow extension    Wrist flexion    Wrist extension    Wrist ulnar deviation    Wrist radial deviation    Wrist pronation    Wrist supination    Grip strength (lbs)    (Blank rows = not tested)  Strength assessed within available range  JOINT MOBILITY TESTING:  Hypomobility of right GHJ  PALPATION:  Tender to palpation about the right shoulder                                                                                                                              TREATMENT  [facility] Adult PT Treatment:                                                DATE: 02/18/2024 UBE L2 x 4 min (fwd/bwd) to improve shoulder endurance and workload capacity Right GHJ mobs all directions at various ranges of elevation Right shoulder PROM all directions Sleeper stretch 5 x 10 sec Wall slide for shoulder elevation 5 x 5 sec Shoulder ER stretch at 90 deg abduction at wall 5 x 5 sec Seated thoracic extension with arms crossed 5 x 5 sec Shoulder IR behind back stretch using towel 5 x 5 sec Shoulder ER with red 2 x 10 Row with blue 2 x 10 Scaption with 3# 2 x 10 Seated horizontal abduction with yellow 2 x 10  PATIENT EDUCATION: Education details: HEP Person educated: Patient Education method: Programmer, Multimedia, Demonstration, Actor cues, Verbal cues Education comprehension: verbalized understanding, returned demonstration, verbal cues required, tactile cues required, and needs further education  HOME EXERCISE PROGRAM: Access Code: [email or web address]    ASSESSMENT: CLINICAL IMPRESSION: Patient tolerated therapy well with no adverse effects. Therapy focused on improving shoulder mobility and progressing her shoulder motion and strength with good tolerance. She demonstrates continued improvement in her right shoulder motion this visit and reports continued improvement in her pain. Incorporated more shoulder and periscapular strengthening with good tolerance. No changes made to her HEP. Patient would benefit from continued skilled PT to progress mobility and strength in order to reduce pain and maximize functional ability.   Eval: Patient is a 68 y.o. female who was seen today for physical therapy evaluation and treatment for chronic right shoulder pain and stiffness. She exhibits limitations in  her right shoulder motion in all planes, and strength deficit of the right shoulder within her available range. Her symptoms  do seem consistent with adhesive capsulitis and she has improved since receiving the joint injection.    OBJECTIVE IMPAIRMENTS: decreased activity tolerance, decreased ROM, decreased strength, postural dysfunction, and pain.   ACTIVITY LIMITATIONS: carrying, lifting, sleeping, bathing, dressing, reach over head, and hygiene/grooming  PARTICIPATION LIMITATIONS: cleaning, driving, and yard work  PERSONAL FACTORS: Time since onset of injury/illness/exacerbation are also affecting patient's functional outcome.    GOALS: Goals reviewed with patient? Yes  SHORT TERM GOALS: Target date: 02/20/2024  Patient will be I with initial HEP in order to progress with therapy. Baseline: HEP provided at eval 02/18/2024: independent with initial HEP Goal status: MET  2.  Patient will report right shoulder pain with activity </= 3/10 in order to reduce functional limitations Baseline: pain 5-6/10 02/18/2024: 3/10 at worst Goal status: MET  LONG TERM GOALS: Target date: 03/19/2024  Patient will be I with final HEP to maintain progress from PT. Baseline: HEP provided at eval Goal status: INITIAL  2.  Patient will report PSFS >/= 6 in order to indicate improvement in their functional ability. Baseline: 1.75 Goal status: INITIAL  3.  Patient will demonstrate right shoulder elevation AROM >/= 140 deg in order to improve overhead reach Baseline: 100 deg Goal status: INITIAL  4.  Patient will demonstrate functional IR reach behind back >/= T12 in order to improve dressing ability Baseline: reach to SIJ Goal status: INITIAL   PLAN: PT FREQUENCY: 1-2x/week  PT DURATION: 8 weeks  PLANNED INTERVENTIONS: 97164- PT Re-evaluation, 97750- Physical Performance Testing, 97110-Therapeutic exercises, 97530- Therapeutic activity, 97112- Neuromuscular re-education, 97535- Self Care, 02859- Manual therapy, 20560 (1-2 muscles), 20561 (3+ muscles)- Dry Needling, Patient/Family education, Joint mobilization,  Joint manipulation, Spinal manipulation, Spinal mobilization, Cryotherapy, and Moist heat  PLAN FOR NEXT SESSION: Review HEP and progress PRN, manual/mobs/stretching for right shoulder, incorporate shoulder strengthening as tolerated   Elaine Daring, PT, DPT, LAT, ATC 02/18/24  10:13 AM Phone: [phone] Fax: [phone]

## 2024-02-21 ENCOUNTER — Other Ambulatory Visit: Payer: Self-pay

## 2024-02-21 ENCOUNTER — Ambulatory Visit: Admitting: Physical Therapy

## 2024-02-21 ENCOUNTER — Encounter: Payer: Self-pay | Admitting: Physical Therapy

## 2024-02-21 DIAGNOSIS — G8929 Other chronic pain: Secondary | ICD-10-CM | POA: Diagnosis not present

## 2024-02-21 DIAGNOSIS — M6281 Muscle weakness (generalized): Secondary | ICD-10-CM | POA: Diagnosis not present

## 2024-02-21 DIAGNOSIS — M25511 Pain in right shoulder: Secondary | ICD-10-CM | POA: Diagnosis not present

## 2024-02-21 NOTE — Therapy (Signed)
 OUTPATIENT PHYSICAL THERAPY TREATMENT   Patient Name: Deanna Bell MRN: [id number] DOB:1955-06-22, 68 y.o., female Today's Date: 02/21/2024   END OF SESSION:  PT End of Session - 02/21/24 0936     Visit Number 6    Number of Visits 17    Date for Recertification  03/19/24    Authorization Type BCBS MCR    Progress Note Due on Visit 10    PT Start Time 0933    PT Stop Time 1012    PT Time Calculation (min) 39 min    Activity Tolerance Patient tolerated treatment well    Behavior During Therapy [facility] for tasks assessed/performed               Past Medical History:  Diagnosis Date   Arterial stenosis    Hyperlipidemia    Past Surgical History:  Procedure Laterality Date   NO PAST SURGERIES     Patient Active Problem List   Diagnosis Date Noted   History of tobacco use 01/10/2024   Chronic pain in right shoulder 01/10/2024   Acute pain of right shoulder 08/23/2023   History of squamous cell carcinoma 02/12/2023   Centrilobular emphysema (HCC) 01/09/2023   Heart murmur, systolic 05/22/2022   Aortic atherosclerosis 05/24/2021   Coronary artery disease involving native coronary artery of native heart without angina pectoris 05/24/2021   HLD (hyperlipidemia) 10/17/2016    PCP: Corwin Antu, FNP  REFERRING PROVIDER: Corwin Antu, FNP  REFERRING DIAG: Chronic pain in right shoulder  THERAPY DIAG:  Chronic right shoulder pain  Muscle weakness (generalized)  Rationale for Evaluation and Treatment: Rehabilitation  ONSET DATE: Chronic (Feb-March 2025)   SUBJECTIVE:       SUBJECTIVE STATEMENT: Patient reports she is doing well and feels the right shoulder continues to improve.  Eval: Patient reports right shoulder pain that started back in February where she was having pain and inability to lay on the right shoulder. She did have a cortisone shot recently that seems to have helped. Before the shot she could barley lift her arm, but can now raise it in  front better but going out the side is still difficult.  Hand dominance: Right  PERTINENT HISTORY: See PMH above  PAIN:  Are you having pain? Yes:  NPRS scale: 0/10 at rest, 3/10 at worst Pain location: Right shoulder Pain description: Sharp, stabbing Aggravating factors: Moving the right shoulder Relieving factors: Rest  PRECAUTIONS: None  PATIENT GOALS: Get rid of pain and be able to raise arm up   OBJECTIVE:  Note: Objective measures were completed at Evaluation unless otherwise noted. PATIENT SURVEYS:  PSFS: 1.75 Reaching: 3 Vacuuming: 2 Washing windows: 2 Sleeping on right side: 0  POSTURE: Rounded shoulder posture  UPPER EXTREMITY ROM:   Active ROM Right eval Left eval Right 02/04/2024 Right 02/07/2024 Right 02/11/2024 Right 02/18/2024  Shoulder flexion 100  130 140 143 145  Shoulder extension        Shoulder abduction 60     160  Shoulder adduction        Shoulder internal rotation SIJ  L5 L1  T12  Shoulder external rotation C7   T1  T2  Elbow flexion        Elbow extension        Wrist flexion        Wrist extension        Wrist ulnar deviation        Wrist radial deviation  Wrist pronation        Wrist supination        (Blank rows = not tested)  UPPER EXTREMITY MMT:  MMT Right eval Left eval  Shoulder flexion 4   Shoulder extension    Shoulder abduction 4-   Shoulder adduction    Shoulder internal rotation 4   Shoulder external rotation 4   Middle trapezius    Lower trapezius    Elbow flexion    Elbow extension    Wrist flexion    Wrist extension    Wrist ulnar deviation    Wrist radial deviation    Wrist pronation    Wrist supination    Grip strength (lbs)    (Blank rows = not tested)  Strength assessed within available range  JOINT MOBILITY TESTING:  Hypomobility of right GHJ  PALPATION:  Tender to palpation about the right shoulder                                                                                                                              TREATMENT  [facility] Adult PT Treatment:                                                DATE: 02/21/2024 UBE L3 x 4 min (fwd/bwd) to improve shoulder endurance and workload capacity Right GHJ mobs all directions at various ranges of elevation Right shoulder PROM all directions Shoulder ER stretch at 90 deg abduction at wall 5 x 5 sec Pec stretch with arm straight at 90 deg and 30 deg abd 5 x 5 sec each Seated thoracic extension with hands behind head x 10 Wall ball walk for shoulder elevation 10 x 5 sec Shoulder IR behind back stretch using towel 10 x 5 sec Bent over row supported on table with 15# 2 x 10 Scaption with 3# 2 x 10  PATIENT EDUCATION: Education details: HEP Person educated: Patient Education method: Programmer, Multimedia, Demonstration, Actor cues, Verbal cues Education comprehension: verbalized understanding, returned demonstration, verbal cues required, tactile cues required, and needs further education  HOME EXERCISE PROGRAM: Access Code: [email or web address]    ASSESSMENT: CLINICAL IMPRESSION: Patient tolerated therapy well with no adverse effects. Therapy continues to focus on progressing right shoulder mobility and strengthening with good tolerance. She does report some discomfort at end range passive elevation and remains limited with her functional IR reaching behind back. She does seem to be progressing well in therapy and tolerates progressions in strengthening well. She reports minimal pain with day-to-day activities. No changes made to her HEP this visit. Patient would benefit from continued skilled PT to progress mobility and strength in order to reduce pain and maximize functional ability.   Eval: Patient is a 68 y.o. female who was seen today for physical therapy evaluation and treatment for chronic right  shoulder pain and stiffness. She exhibits limitations in her right shoulder motion in all planes, and strength deficit of the right  shoulder within her available range. Her symptoms do seem consistent with adhesive capsulitis and she has improved since receiving the joint injection.    OBJECTIVE IMPAIRMENTS: decreased activity tolerance, decreased ROM, decreased strength, postural dysfunction, and pain.   ACTIVITY LIMITATIONS: carrying, lifting, sleeping, bathing, dressing, reach over head, and hygiene/grooming  PARTICIPATION LIMITATIONS: cleaning, driving, and yard work  PERSONAL FACTORS: Time since onset of injury/illness/exacerbation are also affecting patient's functional outcome.    GOALS: Goals reviewed with patient? Yes  SHORT TERM GOALS: Target date: 02/20/2024  Patient will be I with initial HEP in order to progress with therapy. Baseline: HEP provided at eval 02/18/2024: independent with initial HEP Goal status: MET  2.  Patient will report right shoulder pain with activity </= 3/10 in order to reduce functional limitations Baseline: pain 5-6/10 02/18/2024: 3/10 at worst Goal status: MET  LONG TERM GOALS: Target date: 03/19/2024  Patient will be I with final HEP to maintain progress from PT. Baseline: HEP provided at eval Goal status: INITIAL  2.  Patient will report PSFS >/= 6 in order to indicate improvement in their functional ability. Baseline: 1.75 Goal status: INITIAL  3.  Patient will demonstrate right shoulder elevation AROM >/= 140 deg in order to improve overhead reach Baseline: 100 deg Goal status: INITIAL  4.  Patient will demonstrate functional IR reach behind back >/= T12 in order to improve dressing ability Baseline: reach to SIJ Goal status: INITIAL   PLAN: PT FREQUENCY: 1-2x/week  PT DURATION: 8 weeks  PLANNED INTERVENTIONS: 97164- PT Re-evaluation, 97750- Physical Performance Testing, 97110-Therapeutic exercises, 97530- Therapeutic activity, 97112- Neuromuscular re-education, 97535- Self Care, 02859- Manual therapy, 20560 (1-2 muscles), 20561 (3+ muscles)- Dry  Needling, Patient/Family education, Joint mobilization, Joint manipulation, Spinal manipulation, Spinal mobilization, Cryotherapy, and Moist heat  PLAN FOR NEXT SESSION: Review HEP and progress PRN, manual/mobs/stretching for right shoulder, incorporate shoulder strengthening as tolerated   Elaine Daring, PT, DPT, LAT, ATC 02/21/24  10:14 AM Phone: [phone] Fax: [phone]

## 2024-02-28 ENCOUNTER — Ambulatory Visit: Admitting: Physical Therapy

## 2024-02-28 ENCOUNTER — Encounter: Payer: Self-pay | Admitting: Physical Therapy

## 2024-02-28 ENCOUNTER — Other Ambulatory Visit: Payer: Self-pay

## 2024-02-28 DIAGNOSIS — M25511 Pain in right shoulder: Secondary | ICD-10-CM

## 2024-02-28 DIAGNOSIS — M6281 Muscle weakness (generalized): Secondary | ICD-10-CM | POA: Diagnosis not present

## 2024-02-28 DIAGNOSIS — G8929 Other chronic pain: Secondary | ICD-10-CM

## 2024-02-28 NOTE — Therapy (Signed)
 OUTPATIENT PHYSICAL THERAPY TREATMENT  DISCHARGE   Patient Name: Deanna Bell MRN: [id number] DOB:Dec 22, 1955, 68 y.o., female Today's Date: 02/28/2024   END OF SESSION:  PT End of Session - 02/28/24 0940     Visit Number 7    Number of Visits 17    Date for Recertification  03/19/24    Authorization Type [employer] MCR    Progress Note Due on Visit 10    PT Start Time 0940    PT Stop Time 1018    PT Time Calculation (min) 38 min    Activity Tolerance Patient tolerated treatment well    Behavior During Therapy [facility] for tasks assessed/performed                Past Medical History:  Diagnosis Date   Arterial stenosis    Hyperlipidemia    Past Surgical History:  Procedure Laterality Date   NO PAST SURGERIES     Patient Active Problem List   Diagnosis Date Noted   History of tobacco use 01/10/2024   Chronic pain in right shoulder 01/10/2024   Acute pain of right shoulder 08/23/2023   History of squamous cell carcinoma 02/12/2023   Centrilobular emphysema (HCC) 01/09/2023   Heart murmur, systolic 05/22/2022   Aortic atherosclerosis 05/24/2021   Coronary artery disease involving native coronary artery of native heart without angina pectoris 05/24/2021   HLD (hyperlipidemia) 10/17/2016    PCP: Corwin Antu, FNP  REFERRING PROVIDER: Corwin Antu, FNP  REFERRING DIAG: Chronic pain in right shoulder  THERAPY DIAG:  Chronic right shoulder pain  Muscle weakness (generalized)  Rationale for Evaluation and Treatment: Rehabilitation  ONSET DATE: Chronic (Feb-March 2025)   SUBJECTIVE:       SUBJECTIVE STATEMENT: Patient reports she is doing well with no issues.   Eval: Patient reports right shoulder pain that started back in February where she was having pain and inability to lay on the right shoulder. She did have a cortisone shot recently that seems to have helped. Before the shot she could barley lift her arm, but can now raise it in front better but  going out the side is still difficult.  Hand dominance: Right  PERTINENT HISTORY: See PMH above  PAIN:  Are you having pain? Yes:  NPRS scale: 0/10 at rest, 3/10 at worst Pain location: Right shoulder Pain description: Sharp, stabbing Aggravating factors: Moving the right shoulder Relieving factors: Rest  PRECAUTIONS: None  PATIENT GOALS: Get rid of pain and be able to raise arm up   OBJECTIVE:  Note: Objective measures were completed at Evaluation unless otherwise noted. PATIENT SURVEYS:  PSFS: 1.75 Reaching: 3 Vacuuming: 2 Washing windows: 2 Sleeping on right side: 0  02/28/2024: PSFS: 9.5 Reaching: 8 Vacuuming: 10 Washing windows: 10 Sleeping on right side: 10  POSTURE: Rounded shoulder posture  UPPER EXTREMITY ROM:   Active ROM Right eval Left eval Right 02/04/2024 Right 02/07/2024 Right 02/11/2024 Right 02/18/2024 Right 02/28/2024  Shoulder flexion 100  130 140 143 145 160  Shoulder extension         Shoulder abduction 60     160 165  Shoulder adduction         Shoulder internal rotation SIJ  L5 L1  T12 T11  Shoulder external rotation C7   T1  T2 T2  Elbow flexion         Elbow extension         Wrist flexion         Wrist  extension         Wrist ulnar deviation         Wrist radial deviation         Wrist pronation         Wrist supination         (Blank rows = not tested)  UPPER EXTREMITY MMT:  MMT Right eval Left eval  Shoulder flexion 4   Shoulder extension    Shoulder abduction 4-   Shoulder adduction    Shoulder internal rotation 4   Shoulder external rotation 4   Middle trapezius    Lower trapezius    Elbow flexion    Elbow extension    Wrist flexion    Wrist extension    Wrist ulnar deviation    Wrist radial deviation    Wrist pronation    Wrist supination    Grip strength (lbs)    (Blank rows = not tested)  Strength assessed within available range  JOINT MOBILITY TESTING:  Hypomobility of right GHJ  PALPATION:   Tender to palpation about the right shoulder                                                                                                                             TREATMENT  [facility] Adult PT Treatment:                                                DATE: 02/28/2024 UBE L3 x 4 min (fwd/bwd) to improve shoulder endurance and workload capacity Corner pec stretch 3 x 20 sec Step back stretch 5 x 5 sec Shoulder IR behind back stretch using towel 5 x 5 sec Scaption with 3# 2 x 10 Standing ER / IR with yellow 2 x 10 each Bent over row supported on table with 15# 2 x 10 each Standing horizontal abduction with yellow x 10 Standing diagonals with yellow x 10 each Push-up in modified plantigrade 2 x 10  PATIENT EDUCATION: Education details: POC discharge, HEP Person educated: Patient Education method: Explanation Education comprehension: verbalized understanding  HOME EXERCISE PROGRAM: Access Code: [email or web address]    ASSESSMENT: CLINICAL IMPRESSION: Patient tolerated therapy well with no adverse effects. She has made great progress in therapy with her shoulder mobility and functional ability. She has achieved all established goals and will be formally discharged from PT at this time.   Eval: Patient is a 68 y.o. female who was seen today for physical therapy evaluation and treatment for chronic right shoulder pain and stiffness. She exhibits limitations in her right shoulder motion in all planes, and strength deficit of the right shoulder within her available range. Her symptoms do seem consistent with adhesive capsulitis and she has improved since receiving the joint injection.    OBJECTIVE IMPAIRMENTS: decreased activity tolerance, decreased ROM, decreased strength,  postural dysfunction, and pain.   ACTIVITY LIMITATIONS: carrying, lifting, sleeping, bathing, dressing, reach over head, and hygiene/grooming  PARTICIPATION LIMITATIONS: cleaning, driving, and yard work  PERSONAL FACTORS: Time  since onset of injury/illness/exacerbation are also affecting patient's functional outcome.    GOALS: Goals reviewed with patient? Yes  SHORT TERM GOALS: Target date: 02/20/2024  Patient will be I with initial HEP in order to progress with therapy. Baseline: HEP provided at eval 02/18/2024: independent with initial HEP Goal status: MET  2.  Patient will report right shoulder pain with activity </= 3/10 in order to reduce functional limitations Baseline: pain 5-6/10 02/18/2024: 3/10 at worst Goal status: MET  LONG TERM GOALS: Target date: 03/19/2024  Patient will be I with final HEP to maintain progress from PT. Baseline: HEP provided at eval 02/28/2024: independent Goal status: MET  2.  Patient will report PSFS >/= 6 in order to indicate improvement in their functional ability. Baseline: 1.75 02/28/2024: 9.5 Goal status: MET  3.  Patient will demonstrate right shoulder elevation AROM >/= 140 deg in order to improve overhead reach Baseline: 100 deg 02/28/2024: 160 Goal status: MET  4.  Patient will demonstrate functional IR reach behind back >/= T12 in order to improve dressing ability Baseline: reach to SIJ 02/28/2024: T11 Goal status: MET   PLAN: PT FREQUENCY: 1-2x/week  PT DURATION: 8 weeks  PLANNED INTERVENTIONS: 97164- PT Re-evaluation, 97750- Physical Performance Testing, 97110-Therapeutic exercises, 97530- Therapeutic activity, 97112- Neuromuscular re-education, 97535- Self Care, 02859- Manual therapy, 20560 (1-2 muscles), 20561 (3+ muscles)- Dry Needling, Patient/Family education, Joint mobilization, Joint manipulation, Spinal manipulation, Spinal mobilization, Cryotherapy, and Moist heat  PLAN FOR NEXT SESSION: NA - discharge   Elaine Daring, PT, DPT, LAT, ATC 02/28/24  10:18 AM Phone: [phone] Fax: [phone]   PHYSICAL THERAPY DISCHARGE SUMMARY  Visits from Start of Care: 7  Current functional level related to goals / functional  outcomes: See above   Remaining deficits: See above   Education / Equipment: HEP   Patient agrees to discharge. Patient goals were met. Patient is being discharged due to meeting the stated rehab goals.

## 2024-03-10 DIAGNOSIS — L570 Actinic keratosis: Secondary | ICD-10-CM | POA: Diagnosis not present

## 2024-03-10 DIAGNOSIS — C44729 Squamous cell carcinoma of skin of left lower limb, including hip: Secondary | ICD-10-CM | POA: Diagnosis not present

## 2024-03-10 DIAGNOSIS — D692 Other nonthrombocytopenic purpura: Secondary | ICD-10-CM | POA: Diagnosis not present

## 2024-03-10 DIAGNOSIS — D0371 Melanoma in situ of right lower limb, including hip: Secondary | ICD-10-CM | POA: Diagnosis not present

## 2024-03-10 DIAGNOSIS — D225 Melanocytic nevi of trunk: Secondary | ICD-10-CM | POA: Diagnosis not present

## 2024-03-10 DIAGNOSIS — Z85828 Personal history of other malignant neoplasm of skin: Secondary | ICD-10-CM | POA: Diagnosis not present

## 2024-03-16 NOTE — Progress Notes (Unsigned)
     Deanna Maltese T. Deanna Boughner, MD, CAQ Sports Medicine [facility] [address] [place], [address]  Phone: [phone]  FAX: [phone]  Deanna Bell - 68 y.o. female  MRN [id number]  Date of Birth: 02/14/1956  Date: 03/17/2024  PCP: Corwin Antu, FNP  Referral: Corwin Antu, FNP  No chief complaint on file.  Subjective:   Deanna Bell is a 68 y.o. very pleasant female patient with There is no height or weight on file to calculate BMI. who presents with the following:  Discussed the use of AI scribe software for clinical note transcription with the patient, who gave verbal consent to proceed.  Patient presents for ongoing follow-up of right sided frozen shoulder.  The last time I saw her, she had severe loss of motion in all directions, and I gave her an intra-articular injection.  I also had her start on [facility] frozen shoulder protocol. History of Present Illness     Review of Systems is noted in the HPI, as appropriate  Objective:   There were no vitals taken for this visit.  GEN: No acute distress; alert,appropriate. PULM: Breathing comfortably in no respiratory distress PSYCH: Normally interactive.   Laboratory and Imaging Data:  Assessment and Plan:     ICD-10-CM   1. Adhesive capsulitis of right shoulder  M75.01     2. Chronic pain in right shoulder  M25.511    G89.29      Assessment & Plan   Medication Management during today's office visit: No orders of the defined types were placed in this encounter.  There are no discontinued medications.  Orders placed today for conditions managed today: No orders of the defined types were placed in this encounter.   Disposition: No follow-ups on file.  [employer] speech-to-text software was used for transcription in this dictation.  Possible transcriptional errors can occur using Animal nutritionist.   Signed,  Jacques DASEN. Deanna Matty, MD   Outpatient  Encounter Medications as of 03/17/2024  Medication Sig   aspirin  EC (BAYER LOW DOSE) 81 MG tablet TAKE 1 TABLET (81 MG TOTAL) BY MOUTH DAILY. SWALLOW WHOLE.   cholecalciferol (VITAMIN D) 25 MCG (1000 UNIT) tablet Take 1 tablet by mouth daily at 12 noon.   ezetimibe  (ZETIA ) 10 MG tablet Take 1 tablet (10 mg total) by mouth daily. Please keep scheduled appointment for future refills. Thank you.   nitroGLYCERIN  (NITROSTAT ) 0.4 MG SL tablet Place 1 tablet (0.4 mg total) under the tongue every 5 (five) minutes as needed. May take up to 3 tablets in 15 min   rosuvastatin  (CRESTOR ) 10 MG tablet Take 1 tablet (10 mg total) by mouth daily. Please keep scheduled appointment for future refills. Thank you.   No facility-administered encounter medications on file as of 03/17/2024.

## 2024-03-17 ENCOUNTER — Ambulatory Visit: Admitting: Family Medicine

## 2024-03-17 ENCOUNTER — Encounter: Payer: Self-pay | Admitting: Family Medicine

## 2024-03-17 VITALS — BP 110/60 | HR 87 | Temp 97.7°F | Ht 61.0 in | Wt 125.5 lb

## 2024-03-17 DIAGNOSIS — M7501 Adhesive capsulitis of right shoulder: Secondary | ICD-10-CM

## 2024-03-17 DIAGNOSIS — G8929 Other chronic pain: Secondary | ICD-10-CM | POA: Diagnosis not present

## 2024-03-17 DIAGNOSIS — M25511 Pain in right shoulder: Secondary | ICD-10-CM | POA: Diagnosis not present

## 2024-06-06 ENCOUNTER — Ambulatory Visit: Admitting: Internal Medicine

## 2025-01-01 ENCOUNTER — Ambulatory Visit

## 2025-01-02 ENCOUNTER — Ambulatory Visit

## 2025-01-12 ENCOUNTER — Encounter: Admitting: Family
